# Patient Record
Sex: Female | Born: 1990 | Race: Black or African American | Hispanic: No | Marital: Single | Smoking: Former smoker
Health system: Southern US, Community
[De-identification: ages and names within clinical notes are randomized; demographics above are authoritative.]

## PROBLEM LIST (undated history)

## (undated) DIAGNOSIS — K219 Gastro-esophageal reflux disease without esophagitis: Secondary | ICD-10-CM

## (undated) DIAGNOSIS — J189 Pneumonia, unspecified organism: Secondary | ICD-10-CM

## (undated) DIAGNOSIS — G43909 Migraine, unspecified, not intractable, without status migrainosus: Secondary | ICD-10-CM

## (undated) DIAGNOSIS — R569 Unspecified convulsions: Secondary | ICD-10-CM

## (undated) HISTORY — PX: WISDOM TOOTH EXTRACTION: SHX21

---

## 2010-06-03 ENCOUNTER — Emergency Department (HOSPITAL_BASED_OUTPATIENT_CLINIC_OR_DEPARTMENT_OTHER): Admission: EM | Admit: 2010-06-03 | Discharge: 2010-06-03 | Payer: Self-pay | Admitting: Emergency Medicine

## 2010-06-03 ENCOUNTER — Ambulatory Visit: Payer: Self-pay | Admitting: Diagnostic Radiology

## 2010-12-25 LAB — COMPREHENSIVE METABOLIC PANEL
Albumin: 4.1 g/dL (ref 3.5–5.2)
Alkaline Phosphatase: 80 U/L (ref 39–117)
BUN: 8 mg/dL (ref 6–23)
Calcium: 9.3 mg/dL (ref 8.4–10.5)
GFR calc Af Amer: >60 mL/min (ref >60)
GFR calc non Af Amer: >60 mL/min (ref >60)
Glucose, Bld: 82 mg/dL (ref 70–99)
Potassium: 4.3 mEq/L (ref 3.5–5.1)
Sodium: 142 mEq/L (ref 135–145)

## 2010-12-25 LAB — CBC
HCT: 35.7 % — ABNORMAL LOW (ref 36.0–46.0)
MCHC: 33.4 g/dL (ref 30.0–36.0)
Platelets: 223 10*3/uL (ref 150–400)
RDW: 12.9 % (ref 11.5–15.5)

## 2010-12-25 LAB — DIFFERENTIAL
Basophils Relative: 1 % (ref 0–1)
Lymphocytes Relative: 18 % (ref 12–46)
Lymphs Abs: 1.8 10*3/uL (ref 0.7–4.0)
Monocytes Relative: 8 % (ref 3–12)
Neutro Abs: 7.6 10*3/uL (ref 1.7–7.7)
Neutrophils Relative %: 73 % (ref 43–77)

## 2011-07-03 ENCOUNTER — Encounter: Payer: Self-pay | Admitting: *Deleted

## 2011-07-03 ENCOUNTER — Emergency Department (HOSPITAL_BASED_OUTPATIENT_CLINIC_OR_DEPARTMENT_OTHER)
Admission: EM | Admit: 2011-07-03 | Discharge: 2011-07-03 | Disposition: A | Discharge status: Home / Self Care | Payer: Self-pay | Attending: Emergency Medicine | Admitting: Emergency Medicine

## 2011-07-03 DIAGNOSIS — IMO0002 Reserved for concepts with insufficient information to code with codable children: Secondary | ICD-10-CM | POA: Insufficient documentation

## 2011-07-03 DIAGNOSIS — IMO0001 Reserved for inherently not codable concepts without codable children: Secondary | ICD-10-CM | POA: Insufficient documentation

## 2011-07-03 DIAGNOSIS — W57XXXA Bitten or stung by nonvenomous insect and other nonvenomous arthropods, initial encounter: Secondary | ICD-10-CM

## 2011-07-03 DIAGNOSIS — Y92009 Unspecified place in unspecified non-institutional (private) residence as the place of occurrence of the external cause: Secondary | ICD-10-CM | POA: Insufficient documentation

## 2011-07-03 MED ORDER — HYDROXYZINE HCL 25 MG PO TABS: 25.0000 mg | ORAL_TABLET | Freq: Four times a day (QID) | ORAL | Status: AC

## 2011-07-03 NOTE — ED Provider Notes (Signed)
History     CSN: [id number] Arrival date & time: 07/03/2011  7:01 PM  Chief Complaint  Patient presents with  . Insect Bite    HPI  (Consider location/radiation/quality/duration/timing/severity/associated sxs/prior treatment)  HPI  patient thinks she was bitten by a spider today. She walked in spiderweb and then felt small bites on her right arm as well as her right foot. Patient states she noticed a slight raised red areas to both areas. Symptoms have been associated with a burning and itching-type discomfort. There has been no radiation of the discomfort. Symptoms started today. They are mild in nature. She took some Benadryl and has had some improvement of her symptoms. There has been no shortness of breath no fevers or coughing. No trouble breathing or swallowing.  History reviewed. No pertinent past medical history.  History reviewed. No pertinent past surgical history.  History reviewed. No pertinent family history.  History  Substance Use Topics  . Smoking status: Not on file  . Smokeless tobacco: Not on file  . Alcohol Use: Not on file    OB History    Grav Para Term Preterm Abortions TAB SAB Ect Mult Living                  Review of Systems  Review of Systems  All other systems reviewed and are negative.    Allergies  Codeine and Penicillins  Home Medications   Current Outpatient Rx  Name Route Sig Dispense Refill  . DIPHENHYDRAMINE HCL 25 MG PO TABS Oral Take 50 mg by mouth once. For allergic reaction       Physical Exam    BP 141/86  Pulse 110  Temp(Src) 98 F (36.7 C) (Oral)  Resp 20  Ht 5\' 5"  (1.651 m)  Wt 255 lb (115.667 kg)  BMI 42.43 kg/m2  SpO2 98%  LMP 06/23/2011  Physical Exam  Nursing note and vitals reviewed. Constitutional: She appears well-developed and well-nourished. No distress.  HENT:  Head: Normocephalic and atraumatic.  Right Ear: External ear normal.  Left Ear: External ear normal.  Eyes: Conjunctivae are normal.  Right eye exhibits no discharge. Left eye exhibits no discharge. No scleral icterus.  Neck: Neck supple. No tracheal deviation present.  Pulmonary/Chest: Effort normal. No stridor. No respiratory distress.  Musculoskeletal: She exhibits no edema.  Neurological: She is alert. Cranial nerve deficit: no gross deficits.  Skin: Skin is warm and dry. No rash noted.       1 small papular area on the right upper arm, less than 1 cm in size, additional few millimeter lesion right foot, mild erythema, no lymphangitic streaking, no pustules  Psychiatric: She has a normal mood and affect.    ED Course  Procedures (including critical care time)  Labs Reviewed - No data to display No results found.   1. Insect bite      MDM Patient has a mild insect bite. There does not appear to be any evidence of any systemic reaction. There is noted to suggest infection. I recommended the patient take antihistamines. She could also take Tylenol or Advil. She should monitor for any signs of infection.        Celene Kras, MD 07/03/11 [phone]

## 2011-07-03 NOTE — ED Notes (Signed)
Pt states she walked into a spider web and was bitten on both arms and her inner thighs. C/O pain to same. Slight raised red area to right forearm.

## 2011-08-30 ENCOUNTER — Emergency Department (HOSPITAL_BASED_OUTPATIENT_CLINIC_OR_DEPARTMENT_OTHER)
Admission: EM | Admit: 2011-08-30 | Discharge: 2011-08-30 | Disposition: A | Discharge status: Home / Self Care | Payer: No Typology Code available for payment source | Attending: Emergency Medicine | Admitting: Emergency Medicine

## 2011-08-30 ENCOUNTER — Encounter (HOSPITAL_BASED_OUTPATIENT_CLINIC_OR_DEPARTMENT_OTHER): Payer: Self-pay | Admitting: *Deleted

## 2011-08-30 ENCOUNTER — Emergency Department (INDEPENDENT_AMBULATORY_CARE_PROVIDER_SITE_OTHER): Payer: No Typology Code available for payment source

## 2011-08-30 DIAGNOSIS — Y92410 Unspecified street and highway as the place of occurrence of the external cause: Secondary | ICD-10-CM | POA: Insufficient documentation

## 2011-08-30 DIAGNOSIS — M25569 Pain in unspecified knee: Secondary | ICD-10-CM

## 2011-08-30 DIAGNOSIS — S8000XA Contusion of unspecified knee, initial encounter: Secondary | ICD-10-CM | POA: Insufficient documentation

## 2011-08-30 MED ORDER — HYDROCODONE-ACETAMINOPHEN 5-500 MG PO TABS: 1.0000 | ORAL_TABLET | Freq: Four times a day (QID) | ORAL | Status: AC | PRN
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
## 2011-09-14 ENCOUNTER — Other Ambulatory Visit: Payer: Self-pay

## 2011-09-14 ENCOUNTER — Emergency Department (INDEPENDENT_AMBULATORY_CARE_PROVIDER_SITE_OTHER): Payer: No Typology Code available for payment source

## 2011-09-14 ENCOUNTER — Encounter (HOSPITAL_BASED_OUTPATIENT_CLINIC_OR_DEPARTMENT_OTHER): Payer: Self-pay

## 2011-09-14 ENCOUNTER — Emergency Department (HOSPITAL_BASED_OUTPATIENT_CLINIC_OR_DEPARTMENT_OTHER)
Admission: EM | Admit: 2011-09-14 | Discharge: 2011-09-14 | Disposition: A | Discharge status: Home / Self Care | Payer: No Typology Code available for payment source | Attending: Emergency Medicine | Admitting: Emergency Medicine

## 2011-09-14 DIAGNOSIS — R079 Chest pain, unspecified: Secondary | ICD-10-CM | POA: Insufficient documentation

## 2011-09-14 DIAGNOSIS — M549 Dorsalgia, unspecified: Secondary | ICD-10-CM | POA: Insufficient documentation

## 2011-09-14 DIAGNOSIS — R51 Headache: Secondary | ICD-10-CM

## 2011-09-14 DIAGNOSIS — J189 Pneumonia, unspecified organism: Secondary | ICD-10-CM | POA: Insufficient documentation

## 2011-09-14 DIAGNOSIS — R509 Fever, unspecified: Secondary | ICD-10-CM

## 2011-09-14 DIAGNOSIS — R1110 Vomiting, unspecified: Secondary | ICD-10-CM | POA: Insufficient documentation

## 2011-09-14 MED ORDER — IBUPROFEN 800 MG PO TABS
800.0000 mg | ORAL_TABLET | Freq: Once | ORAL | Status: AC
  Administered 2011-09-14: 800 mg via ORAL
  Filled 2011-09-14: qty 1

## 2011-09-14 MED ORDER — AZITHROMYCIN 250 MG PO TABS
500.0000 mg | ORAL_TABLET | Freq: Once | ORAL | Status: AC
  Administered 2011-09-14: 500 mg via ORAL
  Filled 2011-09-14: qty 2

## 2011-09-14 MED ORDER — AZITHROMYCIN 250 MG PO TABS: 250.0000 mg | ORAL_TABLET | Freq: Every day | ORAL | Status: AC

## 2011-09-14 NOTE — ED Provider Notes (Signed)
History     CSN: [id number] Arrival date & time: 09/14/2011  2:55 PM   First MD Initiated Contact with Patient 09/14/11 1553      Chief Complaint  Patient presents with  . Back Pain  . Chest Pain  . Emesis  . Fever    (Consider location/radiation/quality/duration/timing/severity/associated sxs/prior treatment) HPI Comments: Patient presents with one day of central chest pain that is associated with no specific cough.  Patient does note some nasal congestion with this.  She's had one episode of nausea and vomiting today.  She has no abdominal pain.  Patient had not noted fevers at home although she does have one here on initial evaluation.  She's not short of breath.  She does not smoke.  Patient did not try any medications at home for this.    Patient is a 20 y.o. female presenting with back pain, chest pain, vomiting, and fever. The history is provided by the patient. No language interpreter was used.  Back Pain  This is a new problem. The current episode started yesterday. The problem occurs constantly. The problem has not changed since onset.The pain is associated with no known injury. The pain is mild. Associated symptoms include chest pain and a fever. Pertinent negatives include no headaches, no abdominal pain and no dysuria. She has tried nothing for the symptoms.  Chest Pain Primary symptoms include a fever, cough, nausea and vomiting. Pertinent negatives for primary symptoms include no shortness of breath and no abdominal pain.    Emesis  Associated symptoms include cough and a fever. Pertinent negatives include no abdominal pain, no chills, no diarrhea and no headaches.  Fever Primary symptoms of the febrile illness include fever, cough, nausea and vomiting. Primary symptoms do not include headaches, shortness of breath, abdominal pain, diarrhea, dysuria or rash.    History reviewed. No pertinent past medical history.  History reviewed. No pertinent past surgical  history.  No family history on file.  History  Substance Use Topics  . Smoking status: Never Smoker   . Smokeless tobacco: Never Used  . Alcohol Use: No    OB History    Grav Para Term Preterm Abortions TAB SAB Ect Mult Living                  Review of Systems  Constitutional: Positive for fever. Negative for chills.  HENT: Positive for congestion and rhinorrhea.   Eyes: Negative.  Negative for discharge and redness.  Respiratory: Positive for cough. Negative for shortness of breath.   Cardiovascular: Positive for chest pain.  Gastrointestinal: Positive for nausea and vomiting. Negative for abdominal pain and diarrhea.  Genitourinary: Positive for vaginal bleeding. Negative for dysuria and vaginal discharge.       Patient is on her period at this time  Musculoskeletal: Positive for back pain.  Skin: Negative.  Negative for color change and rash.  Neurological: Negative.  Negative for syncope and headaches.  Hematological: Negative.  Negative for adenopathy.  Psychiatric/Behavioral: Negative.  Negative for confusion.  All other systems reviewed and are negative.    Allergies  Codeine and Penicillins  Home Medications   Current Outpatient Rx  Name Route Sig Dispense Refill  . ACETAMINOPHEN 500 MG PO TABS Oral Take 500 mg by mouth every 6 (six) hours as needed. For pain     . NORGESTIM-ETH ESTRAD TRIPHASIC 0.18/0.215/0.25 MG-35 MCG PO TABS Oral Take 1 tablet by mouth daily.      Marland Kitchen DIPHENHYDRAMINE HCL 25 MG PO  TABS Oral Take 50 mg by mouth once. For allergic reaction       BP 129/59  Pulse 106  Temp(Src) 102.6 F (39.2 C) (Oral)  Resp 20  Ht 5\' 6"  (1.676 m)  Wt 255 lb (115.667 kg)  BMI 41.16 kg/m2  SpO2 100%  LMP 09/14/2011  Physical Exam  Nursing note and vitals reviewed. Constitutional: She is oriented to person, place, and time. She appears well-developed and well-nourished.  Non-toxic appearance. She does not have a sickly appearance.  HENT:  Head:  Normocephalic and atraumatic.  Eyes: Conjunctivae, EOM and lids are normal. Pupils are equal, round, and reactive to light. No scleral icterus.  Neck: Trachea normal and normal range of motion. Neck supple.  Cardiovascular: Regular rhythm and normal heart sounds.  Tachycardia present.  Exam reveals no gallop and no friction rub.   No murmur heard. Pulmonary/Chest: Effort normal and breath sounds normal. No respiratory distress. She has no wheezes. She has no rales.  Abdominal: Soft. Normal appearance. There is no tenderness. There is no rebound, no guarding and no CVA tenderness.  Musculoskeletal: Normal range of motion.  Neurological: She is alert and oriented to person, place, and time. She has normal strength.  Skin: Skin is warm, dry and intact. No rash noted.  Psychiatric: She has a normal mood and affect. Her behavior is normal. Judgment and thought content normal.    ED Course  Procedures (including critical care time)  Labs Reviewed - No data to display Dg Chest 2 View  09/14/2011  *RADIOLOGY REPORT*  Clinical Data: Chest pain.  Fever.  Headache.  CHEST - 2 VIEW 09/14/2011:  Comparison: None.  Findings: Focal airspace opacity in the medial left lower lobe. Lungs otherwise clear.  No pleural effusions.  Cardiomediastinal silhouette unremarkable.  Visualized bony thorax intact.  IMPRESSION: Focal left lower lobe pneumonia suspected.  Original Report Authenticated By: Arnell Sieving, M.D.     No diagnosis found.    MDM  Patient presents with chest pain with associated fever and some mild tachycardia although she has a normal oxygen saturation.  Her tachycardia is likely related to her fever.  Patient appears to have a focal pneumonia on evaluation of her chest x-ray as well place her on azithromycin for the next 5 days.  We'll give her an initial dose here.  At this point in time as the patient is otherwise healthy and in no respiratory distress I feel that she is safe for  discharge home.        Nat Christen, MD 09/14/11 [phone]
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
## 2011-09-14 NOTE — ED Notes (Signed)
Pt requested copy of RTW note-second copy printed

## 2011-09-14 NOTE — ED Notes (Signed)
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 And showed ecg to Dr. Golda Acre who told me to have patient rest in waiting room, I got warm blanket for her.

## 2011-09-14 NOTE — ED Notes (Signed)
Pt reports onset of chest tightness (midsternal), back pain and fever last night.

## 2012-03-22 ENCOUNTER — Emergency Department (HOSPITAL_BASED_OUTPATIENT_CLINIC_OR_DEPARTMENT_OTHER): Payer: Self-pay

## 2012-03-22 ENCOUNTER — Ambulatory Visit (HOSPITAL_BASED_OUTPATIENT_CLINIC_OR_DEPARTMENT_OTHER): Payer: No Typology Code available for payment source

## 2012-03-22 ENCOUNTER — Encounter (HOSPITAL_BASED_OUTPATIENT_CLINIC_OR_DEPARTMENT_OTHER): Payer: Self-pay | Admitting: *Deleted

## 2012-03-22 ENCOUNTER — Emergency Department (HOSPITAL_BASED_OUTPATIENT_CLINIC_OR_DEPARTMENT_OTHER)
Admission: EM | Admit: 2012-03-22 | Discharge: 2012-03-22 | Disposition: A | Discharge status: Home / Self Care | Payer: Self-pay | Attending: Emergency Medicine | Admitting: Emergency Medicine

## 2012-03-22 DIAGNOSIS — K219 Gastro-esophageal reflux disease without esophagitis: Secondary | ICD-10-CM | POA: Insufficient documentation

## 2012-03-22 DIAGNOSIS — S8001XA Contusion of right knee, initial encounter: Secondary | ICD-10-CM

## 2012-03-22 DIAGNOSIS — W108XXA Fall (on) (from) other stairs and steps, initial encounter: Secondary | ICD-10-CM | POA: Insufficient documentation

## 2012-03-22 DIAGNOSIS — S8000XA Contusion of unspecified knee, initial encounter: Secondary | ICD-10-CM | POA: Insufficient documentation

## 2012-03-22 HISTORY — DX: Pneumonia, unspecified organism: J18.9

## 2012-03-22 HISTORY — DX: Gastro-esophageal reflux disease without esophagitis: K21.9

## 2012-03-22 LAB — BASIC METABOLIC PANEL
CO2: 29 mEq/L (ref 19–32)
Calcium: 9.3 mg/dL (ref 8.4–10.5)
Creatinine, Ser: 0.7 mg/dL (ref 0.50–1.10)
GFR calc Af Amer: >90 mL/min (ref >90)
Glucose, Bld: 101 mg/dL — ABNORMAL HIGH (ref 70–99)
Sodium: 141 mEq/L (ref 135–145)

## 2012-03-22 LAB — URINALYSIS, ROUTINE W REFLEX MICROSCOPIC
Bilirubin Urine: NEGATIVE
Glucose, UA: NEGATIVE mg/dL
Hgb urine dipstick: NEGATIVE
Ketones, ur: NEGATIVE mg/dL
Protein, ur: NEGATIVE mg/dL
Specific Gravity, Urine: 1.019 (ref 1.005–1.030)
pH: 6.5 (ref 5.0–8.0)

## 2012-03-22 LAB — URINE MICROSCOPIC-ADD ON

## 2012-03-22 LAB — CBC
HCT: 36.6 % (ref 36.0–46.0)
MCV: 88.4 fL (ref 78.0–100.0)
RBC: 4.14 MIL/uL (ref 3.87–5.11)
WBC: 5.8 10*3/uL (ref 4.0–10.5)

## 2012-03-22 LAB — DIFFERENTIAL
Basophils Absolute: 0 10*3/uL (ref 0.0–0.1)
Basophils Relative: 0 % (ref 0–1)
Lymphocytes Relative: 35 % (ref 12–46)
Monocytes Relative: 5 % (ref 3–12)
Neutro Abs: 3.3 10*3/uL (ref 1.7–7.7)

## 2012-03-22 LAB — D-DIMER, QUANTITATIVE: D-Dimer, Quant: <0.22 ug/mL-FEU (ref 0.00–0.48)

## 2012-03-22 NOTE — ED Notes (Signed)
Pt sts "I'm having knee pain but I really want to make sure everything is ok because I took a home pregnancy test and it was positive".

## 2012-03-22 NOTE — Discharge Instructions (Signed)
Contusion A contusion is a deep bruise. Contusions happen when an injury causes bleeding under the skin. Signs of bruising include pain, puffiness (swelling), and discolored skin. The contusion may turn [place], purple, or yellow. HOME CARE   Put ice on the injured area.   Put ice in a plastic bag.   Place a towel between your skin and the bag.   Leave the ice on for 15 to 20 minutes, 3 to 4 times a day.   Only take medicine as told by your doctor.   Rest the injured area.   If possible, raise (elevate) the injured area to lessen puffiness.  GET HELP RIGHT AWAY IF:   You have more bruising or puffiness.   You have pain that is getting worse.   Your puffiness or pain is not helped by medicine.  MAKE SURE YOU:   Understand these instructions.   Will watch your condition.   Will get help right away if you are not doing well or get worse.  Document Released: 03/15/2008 Document Revised: 09/16/2011 Document Reviewed: 08/02/2011 [facility] Patient Information 2012 [place], [place].   You had fallen and fell on the right knee.  Your exam of the right knee was negative.  Also, your pregnancy test was negative today.

## 2012-03-22 NOTE — ED Notes (Signed)
Patient states she fell down approximately 10 steps this morning.  States she is having right knee pain.  States she has recently had a positive pregnancy test and wants to be checked out for her pregnancy.   LMP 02/10/12.

## 2012-03-22 NOTE — ED Provider Notes (Signed)
History     CSN: [id number]  Arrival date & time 03/22/12  1308   First MD Initiated Contact with Patient 03/22/12 1128      Chief Complaint  Patient presents with  . Fall  . Knee Pain    (Consider location/radiation/quality/duration/timing/severity/associated sxs/prior treatment) Patient is a 21 y.o. female presenting with fall and knee pain. The history is provided by the patient.  Fall The accident occurred 1 to 2 hours ago. Incident: Pt fell down steps, hurting her right knee.  She is concerned because she had taken a pregnancy test a few days ago and it was positive and she wanted to be sure her pregnancy was all right.  She has had no vaginal bleeding and was able to walk all right. Distance fallen: Fell down several stairs. There was no blood loss. The point of impact was the right knee. The pain is present in the right knee. The pain is at a severity of 5/10. The pain is moderate. She was ambulatory at the scene. There was no entrapment after the fall. There was no drug use involved in the accident. There was no alcohol use involved in the accident. Associated symptoms comments: Recent positive pregnancy test, LMP 02/10/2012.. The symptoms are aggravated by standing and ambulation. She has tried nothing for the symptoms.  Knee Pain    Past Medical History  Diagnosis Date  . Pneumonia   . Acid reflux     History reviewed. No pertinent past surgical history.  No family history on file.  History  Substance Use Topics  . Smoking status: Never Smoker   . Smokeless tobacco: Never Used  . Alcohol Use: No    OB History    Grav Para Term Preterm Abortions TAB SAB Ect Mult Living                  Review of Systems  Constitutional: Negative.   HENT: Negative.   Eyes: Negative.   Respiratory: Negative.   Cardiovascular: Negative.   Genitourinary:       Reportedly positive pregnancy test; LMP 02/10/2012.  Musculoskeletal:       Right knee injury.  Skin: Negative.     Neurological: Negative.   Hematological: Negative.   Psychiatric/Behavioral: Negative.     Allergies  Codeine and Penicillins  Home Medications   Current Outpatient Rx  Name Route Sig Dispense Refill  . ACETAMINOPHEN 500 MG PO TABS Oral Take 500 mg by mouth every 6 (six) hours as needed. For pain     . DIPHENHYDRAMINE HCL 25 MG PO TABS Oral Take 50 mg by mouth once. For allergic reaction     . NORGESTIM-ETH ESTRAD TRIPHASIC 0.18/0.215/0.25 MG-35 MCG PO TABS Oral Take 1 tablet by mouth daily.        BP 150/88  Pulse 71  Temp 98.8 F (37.1 C) (Oral)  Resp 16  Ht 5\' 5"  (1.651 m)  Wt 250 lb (113.399 kg)  BMI 41.60 kg/m2  SpO2 100%  LMP 02/10/2012  Physical Exam  Nursing note and vitals reviewed. Constitutional: She is oriented to person, place, and time.       Morbidly obese young woman, no distress.  HENT:  Head: Normocephalic and atraumatic.  Right Ear: External ear normal.  Left Ear: External ear normal.  Mouth/Throat: Oropharynx is clear and moist.  Eyes: Conjunctivae and EOM are normal. Pupils are equal, round, and reactive to light.  Neck: Normal range of motion. Neck supple.  Cardiovascular: Normal rate, regular  rhythm and normal heart sounds.   Pulmonary/Chest: Effort normal and breath sounds normal.  Abdominal: Soft. Bowel sounds are normal. She exhibits no distension. There is no tenderness.  Musculoskeletal:       She localizes pain over the right patella. There is no mark on the skin, and no swelling of the region around the patella.  The knee has full range of motion, no effusion, no ligamentous instability.  She has intact pulses, sensation and tendon function in the right foot.  Neurological: She is alert and oriented to person, place, and time.       No sensory or motor deficit.  Skin: Skin is warm and dry.  Psychiatric: She has a normal mood and affect. Her behavior is normal.    ED Course  Procedures (including critical care time)  Labs Reviewed   BASIC METABOLIC PANEL - Abnormal; Notable for the following:    Glucose, Bld 101 (*)     All other components within normal limits  URINALYSIS, ROUTINE W REFLEX MICROSCOPIC - Abnormal; Notable for the following:    Leukocytes, UA SMALL (*)     All other components within normal limits  URINE MICROSCOPIC-ADD ON - Abnormal; Notable for the following:    Squamous Epithelial / LPF FEW (*)     Bacteria, UA FEW (*)     All other components within normal limits  PREGNANCY, URINE  CBC  DIFFERENTIAL  D-DIMER, QUANTITATIVE   Urine pregnancy test was negative.  Pt advised of this.  Exam did not show any indication for imaging.  Reassured and released.   1. Contusion of right knee         Carleene Cooper III, MD 03/22/12 2111

## 2012-07-21 ENCOUNTER — Encounter (HOSPITAL_BASED_OUTPATIENT_CLINIC_OR_DEPARTMENT_OTHER): Payer: Self-pay | Admitting: *Deleted

## 2012-07-21 ENCOUNTER — Emergency Department (HOSPITAL_BASED_OUTPATIENT_CLINIC_OR_DEPARTMENT_OTHER)
Admission: EM | Admit: 2012-07-21 | Discharge: 2012-07-21 | Disposition: A | Discharge status: Home / Self Care | Payer: Self-pay | Attending: Emergency Medicine | Admitting: Emergency Medicine

## 2012-07-21 DIAGNOSIS — Z880 Allergy status to penicillin: Secondary | ICD-10-CM | POA: Insufficient documentation

## 2012-07-21 DIAGNOSIS — J029 Acute pharyngitis, unspecified: Secondary | ICD-10-CM | POA: Insufficient documentation

## 2012-07-21 DIAGNOSIS — Z886 Allergy status to analgesic agent status: Secondary | ICD-10-CM | POA: Insufficient documentation

## 2012-07-21 DIAGNOSIS — K219 Gastro-esophageal reflux disease without esophagitis: Secondary | ICD-10-CM | POA: Insufficient documentation

## 2012-07-21 NOTE — ED Provider Notes (Signed)
History     CSN: [id number]  Arrival date & time 07/21/12  4696   First MD Initiated Contact with Patient 07/21/12 1050      Chief Complaint  Patient presents with  . Sore Throat  . URI    (Consider location/radiation/quality/duration/timing/severity/associated sxs/prior treatment) HPI Pt reports she woke up this morning with dry cough, sore throat, moderate aching and worse with swallowing/coughing. No associated fever. Felt well when she went to bed last night. Denies any vomiting. No nasal congestion. She has had strep pharyngitis previously.   Past Medical History  Diagnosis Date  . Pneumonia   . Acid reflux     History reviewed. No pertinent past surgical history.  No family history on file.  History  Substance Use Topics  . Smoking status: Never Smoker   . Smokeless tobacco: Never Used  . Alcohol Use: No    OB History    Grav Para Term Preterm Abortions TAB SAB Ect Mult Living                  Review of Systems All other systems reviewed and are negative except as noted in HPI.   Allergies  Codeine and Penicillins  Home Medications   Current Outpatient Rx  Name Route Sig Dispense Refill  . ACETAMINOPHEN 500 MG PO TABS Oral Take 500 mg by mouth every 6 (six) hours as needed. For pain     . DIPHENHYDRAMINE HCL 25 MG PO TABS Oral Take 50 mg by mouth once. For allergic reaction     . NORGESTIM-ETH ESTRAD TRIPHASIC 0.18/0.215/0.25 MG-35 MCG PO TABS Oral Take 1 tablet by mouth daily.        BP 140/88  Pulse 63  Temp 98.4 F (36.9 C) (Oral)  Resp 18  Ht 5\' 6"  (1.676 m)  Wt 270 lb (122.471 kg)  BMI 43.58 kg/m2  SpO2 100%  LMP 07/06/2012  Physical Exam  Nursing note and vitals reviewed. Constitutional: She is oriented to person, place, and time. She appears well-developed and well-nourished.  HENT:  Head: Normocephalic and atraumatic.       Tonsils enlarged but no erythema or exudate  Eyes: EOM are normal. Pupils are equal, round, and reactive  to light.  Neck: Normal range of motion. Neck supple.  Cardiovascular: Normal rate, normal heart sounds and intact distal pulses.   Pulmonary/Chest: Effort normal and breath sounds normal.  Abdominal: Bowel sounds are normal. She exhibits no distension. There is no tenderness.  Musculoskeletal: Normal range of motion. She exhibits no edema and no tenderness.  Lymphadenopathy:    She has no cervical adenopathy.  Neurological: She is alert and oriented to person, place, and time. She has normal strength. No cranial nerve deficit or sensory deficit.  Skin: Skin is warm and dry. No rash noted.  Psychiatric: She has a normal mood and affect.    ED Course  Procedures (including critical care time)   Labs Reviewed  RAPID STREP SCREEN  STREP A DNA PROBE   No results found.   No diagnosis found.    MDM  Strep neg, no concerning physical exam findings for strep. Advised OTC symptomatic care. Swab sent for culture.         Charles B. Bernette Mayers, MD 07/21/12 1104

## 2012-07-21 NOTE — ED Notes (Signed)
Patient states she woke up this morning with a sore throat, sinus pain and burning pain in her central chest down to her abdomen.

## 2012-07-22 LAB — STREP A DNA PROBE: Group A Strep Probe: NEGATIVE

## 2012-08-01 IMAGING — CR DG CHEST 2V
2 series · 2 of 2 positions shown · non-contrast
Comparison: None.

CLINICAL DATA: Chest pain.  Fever.  Headache.

CHEST - 2 VIEW 09/14/2011:

[w chest pa]
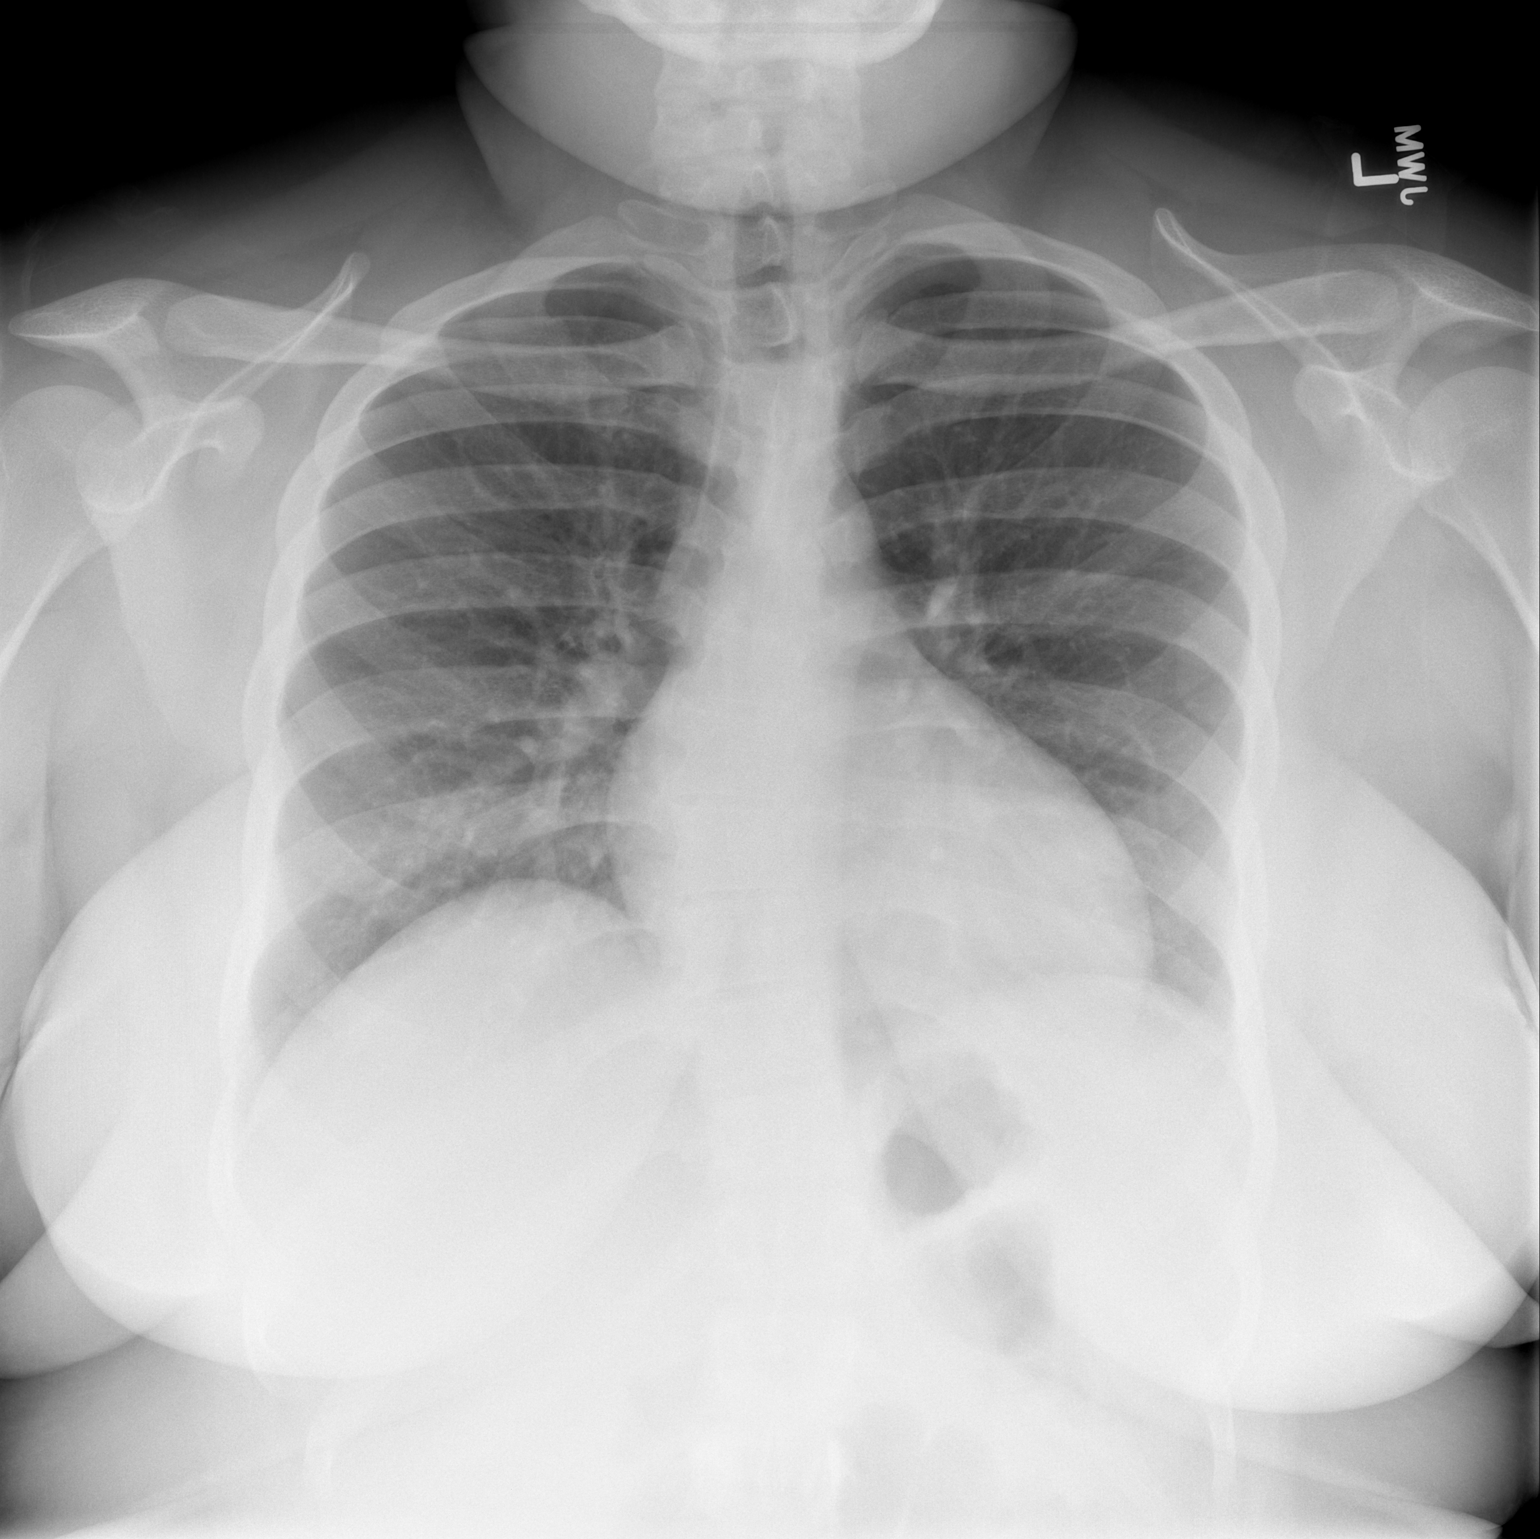

[w chest lat]
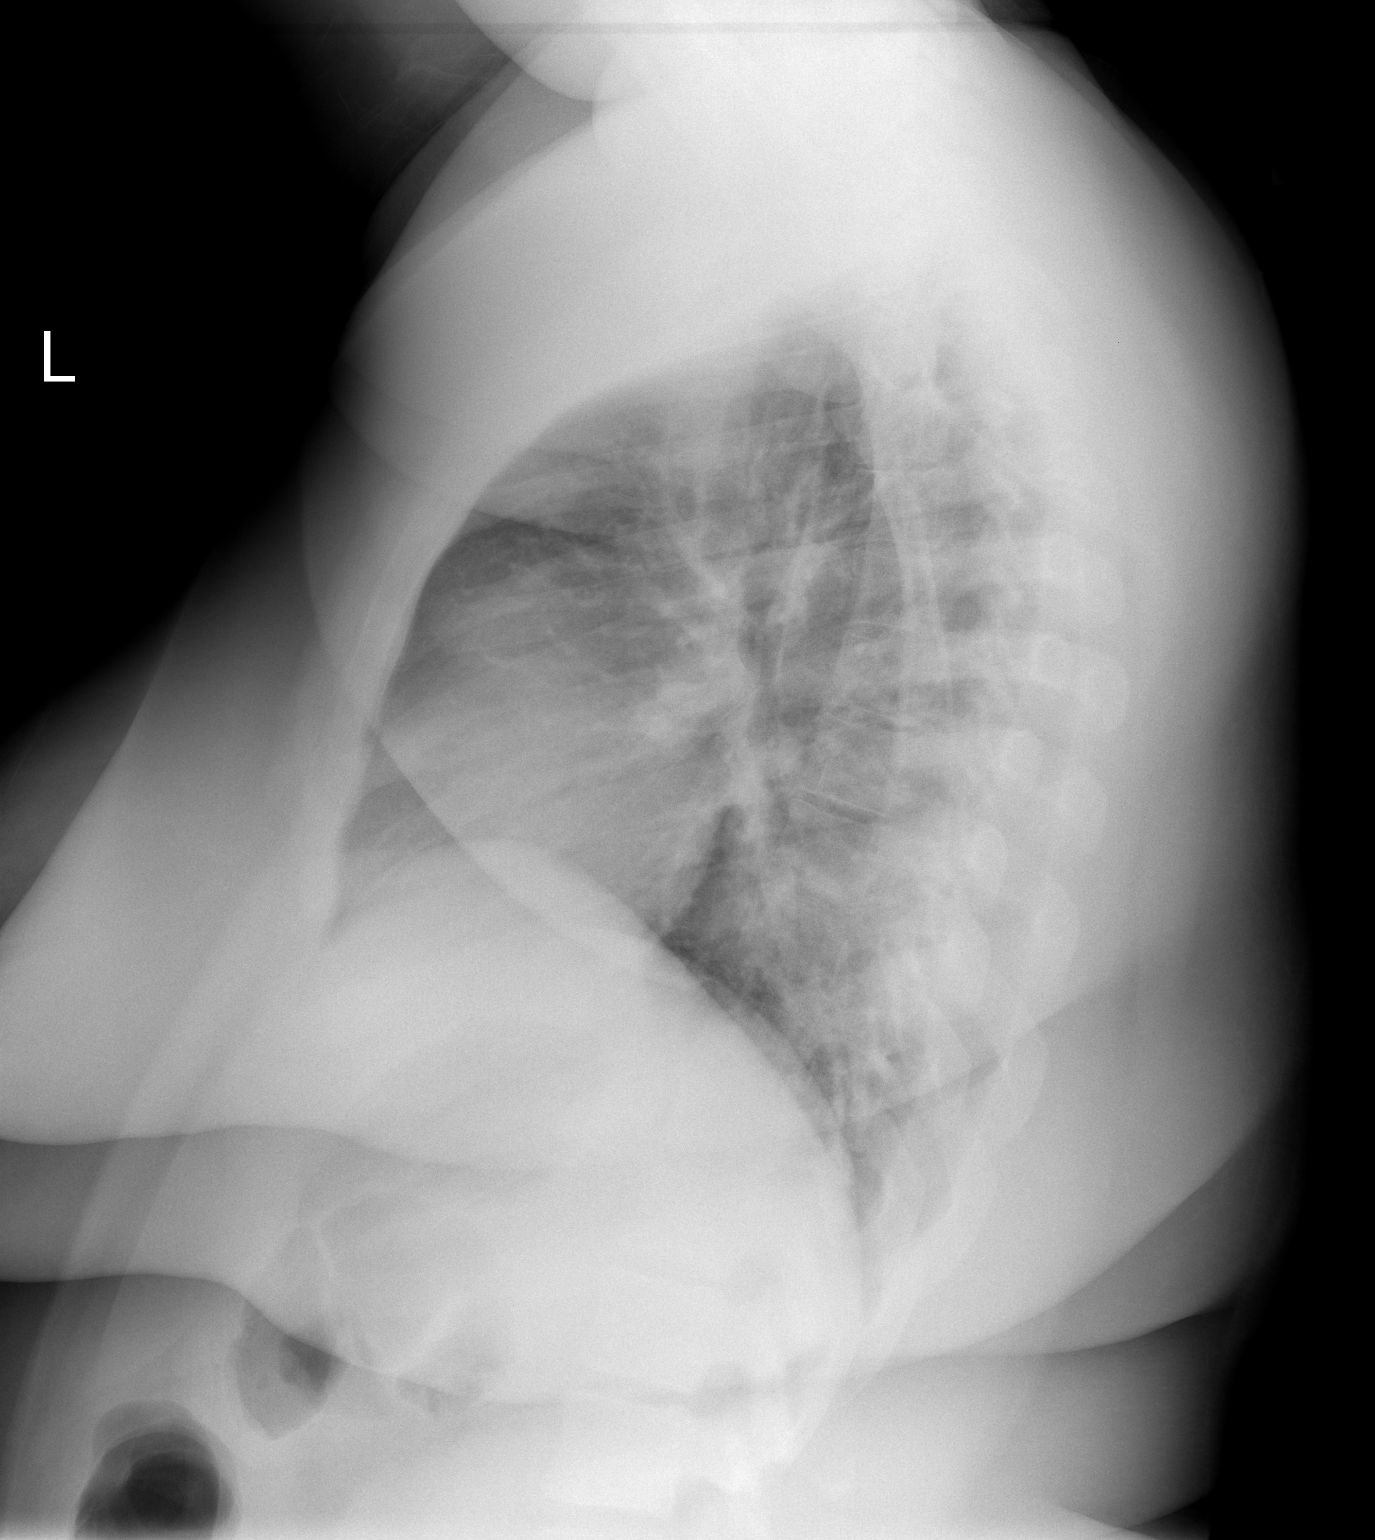

[2 of 2 positions shown; findings below may reference images not displayed]

FINDINGS: Focal airspace opacity in the medial left lower lobe.
Lungs otherwise clear.  No pleural effusions.  Cardiomediastinal
silhouette unremarkable.  Visualized bony thorax intact.
IMPRESSION: Focal left lower lobe pneumonia suspected.

## 2012-08-13 ENCOUNTER — Encounter (HOSPITAL_BASED_OUTPATIENT_CLINIC_OR_DEPARTMENT_OTHER): Payer: Self-pay | Admitting: *Deleted

## 2012-08-13 ENCOUNTER — Emergency Department (HOSPITAL_BASED_OUTPATIENT_CLINIC_OR_DEPARTMENT_OTHER): Payer: Self-pay

## 2012-08-13 ENCOUNTER — Emergency Department (HOSPITAL_BASED_OUTPATIENT_CLINIC_OR_DEPARTMENT_OTHER)
Admission: EM | Admit: 2012-08-13 | Discharge: 2012-08-14 | Disposition: A | Discharge status: Home / Self Care | Payer: Self-pay | Attending: Emergency Medicine | Admitting: Emergency Medicine

## 2012-08-13 DIAGNOSIS — Z8701 Personal history of pneumonia (recurrent): Secondary | ICD-10-CM | POA: Insufficient documentation

## 2012-08-13 DIAGNOSIS — R5381 Other malaise: Secondary | ICD-10-CM | POA: Insufficient documentation

## 2012-08-13 DIAGNOSIS — R05 Cough: Secondary | ICD-10-CM | POA: Insufficient documentation

## 2012-08-13 DIAGNOSIS — R112 Nausea with vomiting, unspecified: Secondary | ICD-10-CM | POA: Insufficient documentation

## 2012-08-13 DIAGNOSIS — R059 Cough, unspecified: Secondary | ICD-10-CM | POA: Insufficient documentation

## 2012-08-13 DIAGNOSIS — R071 Chest pain on breathing: Secondary | ICD-10-CM | POA: Insufficient documentation

## 2012-08-13 DIAGNOSIS — K219 Gastro-esophageal reflux disease without esophagitis: Secondary | ICD-10-CM | POA: Insufficient documentation

## 2012-08-13 DIAGNOSIS — J3489 Other specified disorders of nose and nasal sinuses: Secondary | ICD-10-CM | POA: Insufficient documentation

## 2012-08-13 DIAGNOSIS — J40 Bronchitis, not specified as acute or chronic: Secondary | ICD-10-CM | POA: Insufficient documentation

## 2012-08-13 DIAGNOSIS — Z79899 Other long term (current) drug therapy: Secondary | ICD-10-CM | POA: Insufficient documentation

## 2012-08-13 LAB — PREGNANCY, URINE: Preg Test, Ur: NEGATIVE

## 2012-08-13 NOTE — ED Notes (Signed)
Pt states she has had a cold for about a week. She also reports body aches x 3 days. Hx pneumonia about this time last year. "This feels the same, but worse"

## 2012-08-13 NOTE — ED Notes (Signed)
Pt was triaged by D. Baron Hamper, RN under CM

## 2012-08-13 NOTE — ED Notes (Signed)
MD at bedside. 

## 2012-08-13 NOTE — ED Provider Notes (Signed)
History     CSN: [id number]  Arrival date & time 08/13/12  2213   None     Chief Complaint  Patient presents with  . Generalized Body Aches    (Consider location/radiation/quality/duration/timing/severity/associated sxs/prior treatment) HPI This is a 21 year old female with cold symptoms for the last week and a half. Specifically she has been having nasal congestion, rhinorrhea, cough and malaise. She is not aware she's had a fever. The last 3 days she's had generalized body aches. She also feels short of breath. She has soreness in her chest, worse when she coughs or breathes. Her cough has become productive of yellow sputum. She states she's been nauseated and vomited 5 times today. She is not nauseated at the present time. States she feels just like she did a year ago when she had pneumonia. She is not taking any medication for any of these symptoms.  Past Medical History  Diagnosis Date  . Pneumonia   . Acid reflux     History reviewed. No pertinent past surgical history.  History reviewed. No pertinent family history.  History  Substance Use Topics  . Smoking status: Never Smoker   . Smokeless tobacco: Never Used  . Alcohol Use: No    OB History    Grav Para Term Preterm Abortions TAB SAB Ect Mult Living                  Review of Systems  All other systems reviewed and are negative.    Allergies  Codeine and Penicillins  Home Medications   Current Outpatient Rx  Name  Route  Sig  Dispense  Refill  . ACETAMINOPHEN 500 MG PO TABS   Oral   Take 500 mg by mouth every 6 (six) hours as needed. For pain          . DIPHENHYDRAMINE HCL 25 MG PO TABS   Oral   Take 50 mg by mouth once. For allergic reaction          . NORGESTIM-ETH ESTRAD TRIPHASIC 0.18/0.215/0.25 MG-35 MCG PO TABS   Oral   Take 1 tablet by mouth daily.             BP 124/52  Pulse 116  Temp 98.9 F (37.2 C) (Oral)  Resp 20  Ht 5\' 5"  (1.651 m)  Wt 255 lb (115.667 kg)  BMI  42.43 kg/m2  SpO2 100%  LMP 07/22/2012  Physical Exam General: Well-developed, well-nourished female in no acute distress; appearance consistent with age of record HENT: normocephalic, atraumatic Eyes: pupils equal round and reactive to light; extraocular muscles intact Neck: supple Heart: regular rate and rhythm; no murmurs, rubs or gallops Lungs: clear to auscultation bilaterally; cough Abdomen: soft; obese; mild epigastric tenderness; bowel sounds present Extremities: No deformity; full range of motion; pulses normal; no edema Neurologic: Awake, alert and oriented; motor function intact in all extremities and symmetric; no facial droop Skin: Warm and dry Psychiatric: Normal mood and affect    ED Course  Procedures (including critical care time)     MDM   Nursing notes and vitals signs, including pulse oximetry, reviewed.  Summary of this visit's results, reviewed by myself:  Labs:  Results for orders placed during the hospital encounter of 08/13/12  PREGNANCY, URINE      Component Value Range   Preg Test, Ur NEGATIVE  NEGATIVE    Imaging Studies: Dg Chest 2 View  08/13/2012  *RADIOLOGY REPORT*  Clinical Data: Generalized body aches for 3  days; cold symptoms for 1 week.  CHEST - 2 VIEW  Comparison: Chest radiograph performed 09/14/2011  Findings: The lungs are well-aerated and clear.  There is no evidence of focal opacification, pleural effusion or pneumothorax.  The heart is normal in size; the mediastinal contour is within normal limits.  No acute osseous abnormalities are seen.  IMPRESSION: No acute cardiopulmonary process seen.   Original Report Authenticated By: Tonia Ghent, M.D.    We'll treat for bronchitis given patient's symptoms present for over a week.          Hanley Seamen, MD 08/14/12 [phone]

## 2012-08-14 MED ORDER — ALBUTEROL SULFATE HFA 108 (90 BASE) MCG/ACT IN AERS
2.0000 | INHALATION_SPRAY | RESPIRATORY_TRACT | Status: DC | PRN
  Administered 2012-08-14: 2 via RESPIRATORY_TRACT
  Filled 2012-08-14: qty 6.7

## 2012-08-14 MED ORDER — AZITHROMYCIN 250 MG PO TABS: 250.0000 mg | ORAL_TABLET | Freq: Every day | ORAL | Status: DC

## 2012-08-14 NOTE — Progress Notes (Signed)
RT explained the correct use of an MDI with a spacer.  Patient demonstrated correct technique of using a MDI with a spacer.

## 2012-08-14 NOTE — ED Notes (Signed)
MD at bedside. 

## 2012-09-03 ENCOUNTER — Emergency Department (HOSPITAL_BASED_OUTPATIENT_CLINIC_OR_DEPARTMENT_OTHER): Payer: Self-pay

## 2012-09-03 ENCOUNTER — Emergency Department (HOSPITAL_BASED_OUTPATIENT_CLINIC_OR_DEPARTMENT_OTHER)
Admission: EM | Admit: 2012-09-03 | Discharge: 2012-09-03 | Disposition: A | Discharge status: Home / Self Care | Payer: Self-pay | Attending: Emergency Medicine | Admitting: Emergency Medicine

## 2012-09-03 ENCOUNTER — Encounter (HOSPITAL_BASED_OUTPATIENT_CLINIC_OR_DEPARTMENT_OTHER): Payer: Self-pay | Admitting: Emergency Medicine

## 2012-09-03 DIAGNOSIS — R079 Chest pain, unspecified: Secondary | ICD-10-CM | POA: Insufficient documentation

## 2012-09-03 DIAGNOSIS — Z8701 Personal history of pneumonia (recurrent): Secondary | ICD-10-CM | POA: Insufficient documentation

## 2012-09-03 DIAGNOSIS — R0602 Shortness of breath: Secondary | ICD-10-CM | POA: Insufficient documentation

## 2012-09-03 DIAGNOSIS — K219 Gastro-esophageal reflux disease without esophagitis: Secondary | ICD-10-CM | POA: Insufficient documentation

## 2012-09-03 DIAGNOSIS — J3489 Other specified disorders of nose and nasal sinuses: Secondary | ICD-10-CM | POA: Insufficient documentation

## 2012-09-03 DIAGNOSIS — Z79899 Other long term (current) drug therapy: Secondary | ICD-10-CM | POA: Insufficient documentation

## 2012-09-03 DIAGNOSIS — B349 Viral infection, unspecified: Secondary | ICD-10-CM

## 2012-09-03 DIAGNOSIS — J029 Acute pharyngitis, unspecified: Secondary | ICD-10-CM | POA: Insufficient documentation

## 2012-09-03 DIAGNOSIS — B9789 Other viral agents as the cause of diseases classified elsewhere: Secondary | ICD-10-CM | POA: Insufficient documentation

## 2012-09-03 DIAGNOSIS — R509 Fever, unspecified: Secondary | ICD-10-CM | POA: Insufficient documentation

## 2012-09-03 LAB — RAPID STREP SCREEN (MED CTR MEBANE ONLY): Streptococcus, Group A Screen (Direct): NEGATIVE

## 2012-09-03 MED ORDER — HYDROCODONE-ACETAMINOPHEN 5-325 MG PO TABS: 1.0000 | ORAL_TABLET | ORAL | Status: DC | PRN

## 2012-09-03 NOTE — ED Notes (Signed)
Pt sts she was here for cough, body aches, congestion, fever, and chills 2 weeks ago; continues to have same complaints. Needs work note to return to work.

## 2012-09-03 NOTE — ED Provider Notes (Signed)
Medical screening examination/treatment/procedure(s) were performed by non-physician practitioner and as supervising physician I was immediately available for consultation/collaboration.   Haidy Kackley W. Kailene Steinhart, MD 09/03/12 2056 

## 2012-09-03 NOTE — ED Provider Notes (Signed)
History     CSN: [id number]  Arrival date & time 09/03/12  1842   First MD Initiated Contact with Patient 09/03/12 1903      Chief Complaint  Patient presents with  . Cough    (Consider location/radiation/quality/duration/timing/severity/associated sxs/prior treatment) HPI History provided by pt and prior chart.  Pt c/o cough productive of yellow sputum x 1 month.  Per prior chart, seen for same in ED on 08/13/12, CXR neg and diagnosed w/ bronchitis.  Pt reports that cough has not improved.  It is associated w/ fever (max temp 99.0), sinus pressure, nasal congestion, rhinorrhea, sore throat, diffuse chest pain, SOB when laying flat,  N/V/D, body aches.  Denies abdominal pain and urinary sx.  Denies peripheral edema  No known sick contacts.  No PMH.   Past Medical History  Diagnosis Date  . Pneumonia   . Acid reflux     History reviewed. No pertinent past surgical history.  No family history on file.  History  Substance Use Topics  . Smoking status: Never Smoker   . Smokeless tobacco: Never Used  . Alcohol Use: No    OB History    Grav Para Term Preterm Abortions TAB SAB Ect Mult Living                  Review of Systems  All other systems reviewed and are negative.    Allergies  Codeine and Penicillins  Home Medications   Current Outpatient Rx  Name  Route  Sig  Dispense  Refill  . ACETAMINOPHEN 500 MG PO TABS   Oral   Take 500 mg by mouth every 6 (six) hours as needed. For pain          . DIPHENHYDRAMINE HCL 25 MG PO TABS   Oral   Take 50 mg by mouth once. For allergic reaction          . NORGESTIM-ETH ESTRAD TRIPHASIC 0.18/0.215/0.25 MG-35 MCG PO TABS   Oral   Take 1 tablet by mouth daily.             BP 131/84  Pulse 68  Temp 98.1 F (36.7 C) (Oral)  Resp 20  Ht 5\' 5"  (1.651 m)  Wt 255 lb (115.667 kg)  BMI 42.43 kg/m2  SpO2 99%  LMP 08/22/2012  Physical Exam  Nursing note and vitals reviewed. Constitutional: She is oriented to  person, place, and time. She appears well-developed and well-nourished. No distress.  HENT:  Head: Normocephalic and atraumatic. No trismus in the jaw.  Mouth/Throat: Uvula is midline and mucous membranes are normal. No posterior oropharyngeal edema or posterior oropharyngeal erythema.       Left and right TM appear nml.  Erythema of posterior pharynx and soft palate. Tonsils symmetric but enlarged and w/ exudate.  Uvula mid-line.  No trismus.  Nasal discharge.   Eyes:       Normal appearance  Neck: Normal range of motion. Neck supple.  Cardiovascular: Normal rate and regular rhythm.   Pulmonary/Chest: Effort normal and breath sounds normal.  Musculoskeletal: Normal range of motion.  Lymphadenopathy:    She has no cervical adenopathy.  Neurological: She is alert and oriented to person, place, and time.  Skin: Skin is warm and dry. No rash noted.  Psychiatric: She has a normal mood and affect. Her behavior is normal.    ED Course  Procedures (including critical care time)   Labs Reviewed  RAPID STREP SCREEN   Dg Chest 2  View  09/03/2012  *RADIOLOGY REPORT*  Clinical Data: Cough and congestion.  CHEST - 2 VIEW  Comparison: 08/13/2012.  Findings: Lung volumes are normal.  No consolidative airspace disease.  No pleural effusions.  No pneumothorax.  No pulmonary nodule or mass noted.  Pulmonary vasculature and the cardiomediastinal silhouette are within normal limits.  IMPRESSION: 1. No radiographic evidence of acute cardiopulmonary disease.   Original Report Authenticated By: Trudie Reed, M.D.      1. Viral syndrome       MDM  21yo F diagnosed w/ bronchitis, CXR neg in ED on 08/13/2012.  Returns w/ persistent cough + sore throat, N/V/D.  CXR repeated and neg.  Strep screen obtained and neg.  Will treat symptomatically for viral URI.  She did not have relief w/ albuterol inhaler.  Will prescribe vicodin for cough suppression and recommend sudafed, motrin, rest and fluids.  She has  a PCP to f/u with.  Return precautions discussed. 8:05 PM         Otilio Miu, PA 09/03/12 2005

## 2012-11-06 ENCOUNTER — Encounter (HOSPITAL_BASED_OUTPATIENT_CLINIC_OR_DEPARTMENT_OTHER): Payer: Self-pay | Admitting: *Deleted

## 2012-11-06 ENCOUNTER — Emergency Department (HOSPITAL_BASED_OUTPATIENT_CLINIC_OR_DEPARTMENT_OTHER)
Admission: EM | Admit: 2012-11-06 | Discharge: 2012-11-06 | Disposition: A | Discharge status: Home / Self Care | Payer: Self-pay | Attending: Emergency Medicine | Admitting: Emergency Medicine

## 2012-11-06 DIAGNOSIS — Z8701 Personal history of pneumonia (recurrent): Secondary | ICD-10-CM | POA: Insufficient documentation

## 2012-11-06 DIAGNOSIS — H6690 Otitis media, unspecified, unspecified ear: Secondary | ICD-10-CM | POA: Insufficient documentation

## 2012-11-06 DIAGNOSIS — J329 Chronic sinusitis, unspecified: Secondary | ICD-10-CM | POA: Insufficient documentation

## 2012-11-06 DIAGNOSIS — Z8719 Personal history of other diseases of the digestive system: Secondary | ICD-10-CM | POA: Insufficient documentation

## 2012-11-06 DIAGNOSIS — H6691 Otitis media, unspecified, right ear: Secondary | ICD-10-CM

## 2012-11-06 MED ORDER — CLINDAMYCIN HCL 150 MG PO CAPS: 150.0000 mg | ORAL_CAPSULE | Freq: Four times a day (QID) | ORAL | Status: DC

## 2012-11-06 MED ORDER — AZITHROMYCIN 250 MG PO TABS: 250.0000 mg | ORAL_TABLET | Freq: Every day | ORAL | Status: DC

## 2012-11-06 NOTE — ED Notes (Signed)
MD at bedside. 

## 2012-11-06 NOTE — ED Provider Notes (Signed)
History  This chart was scribed for Jamie Gaskins, MD by Shari Heritage, ED Scribe. The patient was seen in room MH04/MH04. Patient's care was started at 2305.  CSN: [id number]  Arrival date & time 11/06/12  2144   First MD Initiated Contact with Patient 11/06/12 2305      Chief Complaint  Patient presents with  . Otalgia  . Nasal Congestion     Patient is a 22 y.o. female presenting with ear pain. The history is provided by the patient. No language interpreter was used.  Otalgia This is a new problem. The current episode started 2 days ago. There is pain in the right ear. The problem occurs constantly. The problem has not changed since onset.There has been no fever. The pain is moderate. Pertinent negatives include no ear discharge and no cough. Her past medical history does not include chronic ear infection.    HPI Comments: Jamie Archer is a 22 y.o. female who presents to the Emergency Department complaining of moderate, constant, non-radiating right ear pain and fullness onset 2 days ago. Patient is also complaining of mild nasal congestion and sinus pressure. Patient denies significant facial pain. She denies fever, postnasal drip, ear drainage, shortness of breath or cough. Patient has taken Robitussin at home for symptom relief. Patient denies any significant chronic medical conditions. Patietn does not smoke.  Past Medical History  Diagnosis Date  . Pneumonia   . Acid reflux     History reviewed. No pertinent past surgical history.  History reviewed. No pertinent family history.  History  Substance Use Topics  . Smoking status: Never Smoker   . Smokeless tobacco: Never Used  . Alcohol Use: No    OB History    Grav Para Term Preterm Abortions TAB SAB Ect Mult Living                  Review of Systems  Constitutional: Negative for fever.  HENT: Positive for ear pain, congestion and sinus pressure. Negative for postnasal drip and ear discharge.   Respiratory:  Negative for cough and shortness of breath.     Allergies  Codeine and Penicillins  Home Medications   Current Outpatient Rx  Name  Route  Sig  Dispense  Refill  . ACETAMINOPHEN 500 MG PO TABS   Oral   Take 500 mg by mouth every 6 (six) hours as needed. For pain          . DIPHENHYDRAMINE HCL 25 MG PO TABS   Oral   Take 50 mg by mouth once. For allergic reaction          . HYDROCODONE-ACETAMINOPHEN 5-325 MG PO TABS   Oral   Take 1 tablet by mouth every 4 (four) hours as needed for pain.   20 tablet   0   . NORGESTIM-ETH ESTRAD TRIPHASIC 0.18/0.215/0.25 MG-35 MCG PO TABS   Oral   Take 1 tablet by mouth daily.             Triage Vitals: BP 147/78  Pulse 66  Temp 97.8 F (36.6 C) (Oral)  Resp 16  Ht 5\' 5"  (1.651 m)  Wt 250 lb (113.399 kg)  BMI 41.60 kg/m2  SpO2 100%  LMP 10/21/2012  Physical Exam CONSTITUTIONAL: Well developed/well nourished HEAD AND FACE: Normocephalic/atraumatic EYES: EOMI/PERRL ENMT: Mucous membranes moist, nasal congestion, right TM with effusion and erythema noted. NECK: supple no meningeal signs CV: S1/S2 noted, no murmurs/rubs/gallops noted LUNGS: Lungs are clear to auscultation bilaterally,  no apparent distress ABDOMEN: soft, nontender, no rebound or guarding GU:no cva tenderness NEURO: Pt is awake/alert, moves all extremitiesx4 EXTREMITIES: pulses normal, full ROM SKIN: warm, color normal PSYCH: no abnormalities of mood noted  ED Course  Procedures DIAGNOSTIC STUDIES: Oxygen Saturation is 100% on room air, normal by my interpretation.    COORDINATION OF CARE: 11:22 PM- Patient informed of current plan for treatment and evaluation and agrees with plan at this time.    1. Sinusitis   2. Otitis media of right ear       MDM  Nursing notes including past medical history and social history reviewed and considered in documentation       I personally performed the services described in this documentation, which was  scribed in my presence. The recorded information has been reviewed and is accurate.      Jamie Gaskins, MD 11/06/12 [phone]

## 2012-11-06 NOTE — ED Notes (Signed)
Pt c/o sinus congestion and facial pain x 2 days

## 2012-12-14 ENCOUNTER — Encounter (HOSPITAL_BASED_OUTPATIENT_CLINIC_OR_DEPARTMENT_OTHER): Payer: Self-pay

## 2012-12-14 ENCOUNTER — Emergency Department (HOSPITAL_BASED_OUTPATIENT_CLINIC_OR_DEPARTMENT_OTHER)
Admission: EM | Admit: 2012-12-14 | Discharge: 2012-12-14 | Disposition: A | Discharge status: Home / Self Care | Payer: Self-pay | Attending: Emergency Medicine | Admitting: Emergency Medicine

## 2012-12-14 DIAGNOSIS — Z8719 Personal history of other diseases of the digestive system: Secondary | ICD-10-CM | POA: Insufficient documentation

## 2012-12-14 DIAGNOSIS — Z8701 Personal history of pneumonia (recurrent): Secondary | ICD-10-CM | POA: Insufficient documentation

## 2012-12-14 DIAGNOSIS — G43909 Migraine, unspecified, not intractable, without status migrainosus: Secondary | ICD-10-CM | POA: Insufficient documentation

## 2012-12-14 DIAGNOSIS — R112 Nausea with vomiting, unspecified: Secondary | ICD-10-CM | POA: Insufficient documentation

## 2012-12-14 HISTORY — DX: Migraine, unspecified, not intractable, without status migrainosus: G43.909

## 2012-12-14 MED ORDER — KETOROLAC TROMETHAMINE 60 MG/2ML IM SOLN
60.0000 mg | Freq: Once | INTRAMUSCULAR | Status: AC
  Administered 2012-12-14: 60 mg via INTRAMUSCULAR
  Filled 2012-12-14: qty 2

## 2012-12-14 MED ORDER — DIPHENHYDRAMINE HCL 50 MG/ML IJ SOLN
25.0000 mg | Freq: Once | INTRAMUSCULAR | Status: AC
  Administered 2012-12-14: 25 mg via INTRAMUSCULAR
  Filled 2012-12-14: qty 1

## 2012-12-14 MED ORDER — METOCLOPRAMIDE HCL 5 MG/ML IJ SOLN
10.0000 mg | Freq: Once | INTRAMUSCULAR | Status: AC
  Administered 2012-12-14: 10 mg via INTRAMUSCULAR
  Filled 2012-12-14: qty 2

## 2012-12-14 NOTE — ED Notes (Signed)
D/c home with ride 

## 2012-12-14 NOTE — ED Provider Notes (Signed)
History     CSN: [id number]  Arrival date & time 12/14/12  2057   First MD Initiated Contact with Patient 12/14/12 2112      Chief Complaint  Patient presents with  . Migraine    (Consider location/radiation/quality/duration/timing/severity/associated sxs/prior treatment) HPI Comments: Patient presents to the ER for evaluation of migraine headache. Patient reports that she has a history of recurrent migraines. Patient has had a headache for 4 days. Patient reports a bitemporal throbbing headache associated with light sensitivity, sound sensitivity nausea and vomiting. She reports that the headache is similar to previous migraines that she has had. No fever, neck pain or neck stiffness. Headache came on slowly and progressively worsened, no sudden thunderclap headache.  Patient is a 22 y.o. female presenting with migraines.  Migraine Associated symptoms include headaches.    Past Medical History  Diagnosis Date  . Pneumonia   . Acid reflux   . Migraine     History reviewed. No pertinent past surgical history.  No family history on file.  History  Substance Use Topics  . Smoking status: Never Smoker   . Smokeless tobacco: Never Used  . Alcohol Use: No    OB History   Grav Para Term Preterm Abortions TAB SAB Ect Mult Living                  Review of Systems  Constitutional: Negative for fever.  HENT: Negative for neck pain and neck stiffness.   Gastrointestinal: Positive for nausea and vomiting.  Neurological: Positive for headaches.  All other systems reviewed and are negative.    Allergies  Zithromax; Codeine; and Penicillins  Home Medications   Current Outpatient Rx  Name  Route  Sig  Dispense  Refill  . clindamycin (CLEOCIN) 150 MG capsule   Oral   Take 1 capsule (150 mg total) by mouth every 6 (six) hours.   28 capsule   0   . Norgestimate-Ethinyl Estradiol Triphasic (ORTHO TRI-CYCLEN, 28,) 0.18/0.215/0.25 MG-35 MCG tablet   Oral   Take 1 tablet  by mouth daily.             BP 154/67  Pulse 72  Temp(Src) 97.9 F (36.6 C) (Oral)  Resp 16  Ht 5\' 4"  (1.626 m)  Wt 285 lb (129.275 kg)  BMI 48.9 kg/m2  SpO2 99%  LMP 10/21/2012  Physical Exam  Constitutional: She is oriented to person, place, and time. She appears well-developed and well-nourished. No distress.  HENT:  Head: Normocephalic and atraumatic.  Right Ear: Hearing normal.  Nose: Nose normal.  Mouth/Throat: Oropharynx is clear and moist and mucous membranes are normal.  Eyes: Conjunctivae and EOM are normal. Pupils are equal, round, and reactive to light.  Neck: Normal range of motion. Neck supple.  Cardiovascular: Normal rate, regular rhythm, S1 normal and S2 normal.  Exam reveals no gallop and no friction rub.   No murmur heard. Pulmonary/Chest: Effort normal and breath sounds normal. No respiratory distress. She exhibits no tenderness.  Abdominal: Soft. Normal appearance and bowel sounds are normal. There is no hepatosplenomegaly. There is no tenderness. There is no rebound, no guarding, no tenderness at McBurney's point and negative Murphy's sign. No hernia.  Musculoskeletal: Normal range of motion.  Neurological: She is alert and oriented to person, place, and time. She has normal strength. No cranial nerve deficit or sensory deficit. Coordination normal. GCS eye subscore is 4. GCS verbal subscore is 5. GCS motor subscore is 6.  Skin: Skin is  warm, dry and intact. No rash noted. No cyanosis.  Psychiatric: She has a normal mood and affect. Her speech is normal and behavior is normal. Thought content normal.    ED Course  Procedures (including critical care time)  Labs Reviewed - No data to display No results found.   Diagnosis: Migraine headache    MDM  Patient presents to ER with migraine headache. Patient has a history of recurrent migraines with similar qualities. She does not feel that there are any unusual symptoms compared to previous migraines.  Headache was slow in onset, progressively worsening. Her neurologic exam is entirely normal. Because of the previous history of migraines with similar quality, no workup is necessary.        Gilda Crease, MD 12/14/12 2122

## 2012-12-14 NOTE — ED Notes (Signed)
C/o migraine HA x 4 days 

## 2012-12-14 NOTE — ED Notes (Signed)
MD at bedside. 

## 2012-12-18 ENCOUNTER — Emergency Department (HOSPITAL_BASED_OUTPATIENT_CLINIC_OR_DEPARTMENT_OTHER)
Admission: EM | Admit: 2012-12-18 | Discharge: 2012-12-18 | Disposition: A | Discharge status: Home / Self Care | Payer: Self-pay | Attending: Emergency Medicine | Admitting: Emergency Medicine

## 2012-12-18 ENCOUNTER — Encounter (HOSPITAL_BASED_OUTPATIENT_CLINIC_OR_DEPARTMENT_OTHER): Payer: Self-pay | Admitting: Family Medicine

## 2012-12-18 DIAGNOSIS — Z8719 Personal history of other diseases of the digestive system: Secondary | ICD-10-CM | POA: Insufficient documentation

## 2012-12-18 DIAGNOSIS — F17200 Nicotine dependence, unspecified, uncomplicated: Secondary | ICD-10-CM | POA: Insufficient documentation

## 2012-12-18 DIAGNOSIS — R51 Headache: Secondary | ICD-10-CM

## 2012-12-18 DIAGNOSIS — G43909 Migraine, unspecified, not intractable, without status migrainosus: Secondary | ICD-10-CM | POA: Insufficient documentation

## 2012-12-18 DIAGNOSIS — J020 Streptococcal pharyngitis: Secondary | ICD-10-CM | POA: Insufficient documentation

## 2012-12-18 DIAGNOSIS — Z8701 Personal history of pneumonia (recurrent): Secondary | ICD-10-CM | POA: Insufficient documentation

## 2012-12-18 DIAGNOSIS — J029 Acute pharyngitis, unspecified: Secondary | ICD-10-CM

## 2012-12-18 MED ORDER — HYDROCODONE-ACETAMINOPHEN 5-325 MG PO TABS
1.0000 | ORAL_TABLET | Freq: Once | ORAL | Status: AC
  Administered 2012-12-18: 1 via ORAL
  Filled 2012-12-18: qty 1

## 2012-12-18 MED ORDER — IBUPROFEN 400 MG PO TABS
600.0000 mg | ORAL_TABLET | Freq: Once | ORAL | Status: AC
  Administered 2012-12-18: 600 mg via ORAL
  Filled 2012-12-18: qty 1

## 2012-12-18 NOTE — ED Notes (Signed)
D/c home with ride 

## 2012-12-18 NOTE — ED Notes (Signed)
Pt c/o sore throat x 1 day and migraine x 1 wk. Pt sts she was seen and treated here 4 days ago for migraine.

## 2012-12-18 NOTE — ED Provider Notes (Signed)
History     CSN: [id number]  Arrival date & time 12/18/12  1154   First MD Initiated Contact with Patient 12/18/12 1243      Chief Complaint  Patient presents with  . Sore Throat  . Migraine    (Consider location/radiation/quality/duration/timing/severity/associated sxs/prior treatment) Patient is a 22 y.o. female presenting with pharyngitis and migraines. The history is provided by the patient.  Sore Throat Pertinent negatives include no chest pain, no abdominal pain, no headaches and no shortness of breath.  Migraine Pertinent negatives include no chest pain, no abdominal pain, no headaches and no shortness of breath.  pt c/o sore throat for past day. Dull, moderate, bil. No unilateral throat pain or swelling. No trouble breathing or swallowing. No change in voice. Denies nasal congestion or purulent drainage. No sinus pain. Does note dull frontal headache, mild, c/w prior headaches. Hx migranies. Denies any severe head pain. No nv. No eye pain or change in vision. No numbness/weaknesss. No neck pain or stiffness. Denies fever/chills. No known ill contacts or known strep or mono exposure.      Past Medical History  Diagnosis Date  . Pneumonia   . Acid reflux   . Migraine     History reviewed. No pertinent past surgical history.  No family history on file.  History  Substance Use Topics  . Smoking status: Current Every Day Smoker  . Smokeless tobacco: Never Used  . Alcohol Use: Yes    OB History   Grav Para Term Preterm Abortions TAB SAB Ect Mult Living                  Review of Systems  Constitutional: Negative for fever and chills.  HENT: Positive for sore throat. Negative for neck pain.   Eyes: Negative for redness.  Respiratory: Negative for cough and shortness of breath.   Cardiovascular: Negative for chest pain.  Gastrointestinal: Negative for vomiting, abdominal pain and diarrhea.  Genitourinary: Negative for flank pain.  Musculoskeletal: Negative  for back pain.  Skin: Negative for rash.  Neurological: Negative for headaches.  Hematological: Does not bruise/bleed easily.  Psychiatric/Behavioral: Negative for confusion.    Allergies  Zithromax; Codeine; and Penicillins  Home Medications   Current Outpatient Rx  Name  Route  Sig  Dispense  Refill  . clindamycin (CLEOCIN) 150 MG capsule   Oral   Take 1 capsule (150 mg total) by mouth every 6 (six) hours.   28 capsule   0   . Norgestimate-Ethinyl Estradiol Triphasic (ORTHO TRI-CYCLEN, 28,) 0.18/0.215/0.25 MG-35 MCG tablet   Oral   Take 1 tablet by mouth daily.             BP 171/70  Pulse 60  Temp(Src) 98.8 F (37.1 C) (Oral)  Resp 20  Ht 5\' 6"  (1.676 m)  Wt 235 lb (106.595 kg)  BMI 37.95 kg/m2  SpO2 98%  LMP 10/21/2012  Physical Exam  Nursing note and vitals reviewed. Constitutional: She appears well-developed and well-nourished. No distress.  HENT:  Nose: Nose normal.  Mouth/Throat: Oropharynx is clear and moist.  No sinus or temporal tenderness.  Pharynx mildly erythematous, no exudate, no asymmetric swelling or abscess. No trismus.   Eyes: Conjunctivae are normal. Pupils are equal, round, and reactive to light. No scleral icterus.  Neck: Normal range of motion. Neck supple. No tracheal deviation present.  No stiffness or rigidity  Cardiovascular: Normal rate.   Pulmonary/Chest: Effort normal. No respiratory distress.  Abdominal: Normal appearance. She exhibits  no distension.  Musculoskeletal: She exhibits no edema.  Lymphadenopathy:    She has no cervical adenopathy.  Neurological: She is alert.  Skin: Skin is warm and dry. No rash noted. She is not diaphoretic.  Psychiatric: She has a normal mood and affect.    ED Course  Procedures (including critical care time)  Results for orders placed during the hospital encounter of 12/18/12  RAPID STREP SCREEN      Result Value Range   Streptococcus, Group A Screen (Direct) NEGATIVE  NEGATIVE         MDM  No meds pta. Pt has ride, does not have to drive.  vicodin po, motrin po. Strep screen.  Reviewed nursing notes and prior charts for additional history.   Recheck pt comfortable. Appears stable for d/c.         Suzi Roots, MD 12/18/12 1330

## 2012-12-26 ENCOUNTER — Emergency Department (HOSPITAL_BASED_OUTPATIENT_CLINIC_OR_DEPARTMENT_OTHER): Payer: Self-pay

## 2012-12-26 ENCOUNTER — Encounter (HOSPITAL_BASED_OUTPATIENT_CLINIC_OR_DEPARTMENT_OTHER): Payer: Self-pay | Admitting: *Deleted

## 2012-12-26 ENCOUNTER — Emergency Department (HOSPITAL_BASED_OUTPATIENT_CLINIC_OR_DEPARTMENT_OTHER)
Admission: EM | Admit: 2012-12-26 | Discharge: 2012-12-26 | Disposition: A | Discharge status: Home / Self Care | Payer: Self-pay | Attending: Emergency Medicine | Admitting: Emergency Medicine

## 2012-12-26 DIAGNOSIS — Z8719 Personal history of other diseases of the digestive system: Secondary | ICD-10-CM | POA: Insufficient documentation

## 2012-12-26 DIAGNOSIS — Z8701 Personal history of pneumonia (recurrent): Secondary | ICD-10-CM | POA: Insufficient documentation

## 2012-12-26 DIAGNOSIS — Z3202 Encounter for pregnancy test, result negative: Secondary | ICD-10-CM | POA: Insufficient documentation

## 2012-12-26 DIAGNOSIS — Z8679 Personal history of other diseases of the circulatory system: Secondary | ICD-10-CM | POA: Insufficient documentation

## 2012-12-26 DIAGNOSIS — F17200 Nicotine dependence, unspecified, uncomplicated: Secondary | ICD-10-CM | POA: Insufficient documentation

## 2012-12-26 DIAGNOSIS — R079 Chest pain, unspecified: Secondary | ICD-10-CM | POA: Insufficient documentation

## 2012-12-26 LAB — CBC WITH DIFFERENTIAL/PLATELET
Basophils Relative: 0 % (ref 0–1)
Eosinophils Absolute: 0.2 10*3/uL (ref 0.0–0.7)
Eosinophils Relative: 2 % (ref 0–5)
Lymphs Abs: 2.2 10*3/uL (ref 0.7–4.0)
MCH: 29.6 pg (ref 26.0–34.0)
MCHC: 32.9 g/dL (ref 30.0–36.0)
MCV: 90.2 fL (ref 78.0–100.0)
Platelets: 235 10*3/uL (ref 150–400)
RBC: 3.88 MIL/uL (ref 3.87–5.11)
RDW: 13.1 % (ref 11.5–15.5)

## 2012-12-26 LAB — BASIC METABOLIC PANEL
Calcium: 9.2 mg/dL (ref 8.4–10.5)
GFR calc Af Amer: >90 mL/min (ref >90)
GFR calc non Af Amer: >90 mL/min (ref >90)
Glucose, Bld: 106 mg/dL — ABNORMAL HIGH (ref 70–99)
Sodium: 141 mEq/L (ref 135–145)

## 2012-12-26 LAB — TROPONIN I: Troponin I: <0.3 ng/mL (ref <0.30)

## 2012-12-26 MED ORDER — OXYCODONE-ACETAMINOPHEN 5-325 MG PO TABS: 2.0000 | ORAL_TABLET | ORAL | Status: DC | PRN

## 2012-12-26 MED ORDER — IBUPROFEN 800 MG PO TABS: 800.0000 mg | ORAL_TABLET | Freq: Three times a day (TID) | ORAL | Status: DC

## 2012-12-26 MED ORDER — KETOROLAC TROMETHAMINE 60 MG/2ML IM SOLN
60.0000 mg | Freq: Once | INTRAMUSCULAR | Status: AC
  Administered 2012-12-26: 60 mg via INTRAMUSCULAR
  Filled 2012-12-26: qty 2

## 2012-12-26 MED ORDER — KETOROLAC TROMETHAMINE 30 MG/ML IJ SOLN: 30.0000 mg | Freq: Once | INTRAMUSCULAR | Status: DC

## 2012-12-26 NOTE — ED Notes (Signed)
Sudden onset pain in the center of her chest. Feels like something is hitting her in the chest.

## 2012-12-26 NOTE — ED Notes (Signed)
Pt. Is in no distress.  Pt. Has no shortness of breath noted.

## 2012-12-26 NOTE — ED Provider Notes (Signed)
History     CSN: [id number]  Arrival date & time 12/26/12  2200   First MD Initiated Contact with Patient 12/26/12 2208      Chief Complaint  Patient presents with  . Chest Pain    (Consider location/radiation/quality/duration/timing/severity/associated sxs/prior treatment) The history is provided by the patient.  Jamie Archer is a 22 y.o. female history of acid reflux, migraine, current smoker here presenting with chest pain. Sudden onset of chest pain without an hour before arrival. She was resting and suddenly happened. She felt sharp pain in the center of her chest felt like someone is hitting her in the chest. Denies any chest trauma or straining her muscles recently. No nausea vomiting or abdominal pain. Fevers or chills. No history of MI or stents. Just finished menses last week.    Past Medical History  Diagnosis Date  . Pneumonia   . Acid reflux   . Migraine     History reviewed. No pertinent past surgical history.  No family history on file.  History  Substance Use Topics  . Smoking status: Current Every Day Smoker  . Smokeless tobacco: Never Used  . Alcohol Use: Yes    OB History   Grav Para Term Preterm Abortions TAB SAB Ect Mult Living                  Review of Systems  Cardiovascular: Positive for chest pain.  All other systems reviewed and are negative.    Allergies  Zithromax; Codeine; and Penicillins  Home Medications   Current Outpatient Rx  Name  Route  Sig  Dispense  Refill  . clindamycin (CLEOCIN) 150 MG capsule   Oral   Take 1 capsule (150 mg total) by mouth every 6 (six) hours.   28 capsule   0   . Norgestimate-Ethinyl Estradiol Triphasic (ORTHO TRI-CYCLEN, 28,) 0.18/0.215/0.25 MG-35 MCG tablet   Oral   Take 1 tablet by mouth daily.             BP 124/95  Pulse 74  Temp(Src) 97.9 F (36.6 C) (Oral)  Resp 16  Wt 235 lb (106.595 kg)  BMI 37.95 kg/m2  SpO2 99%  LMP 12/15/2012  Physical Exam  Nursing note and vitals  reviewed. Constitutional: She is oriented to person, place, and time.  Obese, uncomfortable   HENT:  Head: Normocephalic.  Mouth/Throat: Oropharynx is clear and moist.  Eyes: Conjunctivae are normal. Pupils are equal, round, and reactive to light.  Neck: Normal range of motion. Neck supple.  Cardiovascular: Normal rate, regular rhythm and normal heart sounds.   Pulmonary/Chest: Effort normal and breath sounds normal. No respiratory distress. She has no wheezes. She has no rales.  ? Reproducible sternal tenderness   Abdominal: Soft. Bowel sounds are normal. She exhibits no distension. There is no tenderness. There is no rebound and no guarding.  Obese   Musculoskeletal: Normal range of motion. She exhibits no edema and no tenderness.  Neurological: She is alert and oriented to person, place, and time.  Skin: Skin is warm and dry.  Psychiatric: She has a normal mood and affect. Her behavior is normal. Judgment and thought content normal.    ED Course  Procedures (including critical care time)  Labs Reviewed  CBC WITH DIFFERENTIAL - Abnormal; Notable for the following:    Hemoglobin 11.5 (*)    HCT 35.0 (*)    All other components within normal limits  BASIC METABOLIC PANEL - Abnormal; Notable for the following:  Glucose, Bld 106 (*)    All other components within normal limits  PREGNANCY, URINE  TROPONIN I   Dg Chest 2 View  12/26/2012  *RADIOLOGY REPORT*  Clinical Data: Acute onset mid chest pain and shortness of breath. Smoker.  CHEST - 2 VIEW  Comparison: Two-view chest x-ray 09/03/2012, 08/13/2012, 09/14/2011.  Findings: Suboptimal inspiration due to body habitus which accounts for crowded bronchovascular markings at the bases and accentuates the cardiac silhouette.  Taking this into account, cardiomediastinal silhouette unremarkable and unchanged.   Lungs clear.  Bronchovascular markings normal.  Pulmonary vascularity normal.  No pneumothorax.  No pleural effusions.  Thoracic  scoliosis convex right compensatory thoracolumbar scoliosis convex left as noted previously.  IMPRESSION: Suboptimal inspiration.  No acute cardiopulmonary disease.   Original Report Authenticated By: Hulan Saas, M.D.      No diagnosis found.   Date: 12/26/2012  Rate: 83  Rhythm: normal sinus rhythm  QRS Axis: normal  Intervals: normal  ST/T Wave abnormalities: normal  Conduction Disutrbances:none  Narrative Interpretation:   Old EKG Reviewed: unchanged    MDM  Jamie Archer is a 22 y.o. female here with chest pain. Likely MSK in origin. Low risk for ACS, not concerned for PE. Given that she is smoker so will get cxr, trop x 1. Will give toradol and reassess.   11:14 PM Felt better. CXR nl. Labs nl, Trop neg x 1. Likely muscle strain. Will d/c home on motrin with percocet for break through pain.         Richardean Canal, MD 12/26/12 [phone]

## 2013-03-27 ENCOUNTER — Encounter (HOSPITAL_BASED_OUTPATIENT_CLINIC_OR_DEPARTMENT_OTHER): Payer: Self-pay

## 2013-03-27 ENCOUNTER — Emergency Department (HOSPITAL_BASED_OUTPATIENT_CLINIC_OR_DEPARTMENT_OTHER)
Admission: EM | Admit: 2013-03-27 | Discharge: 2013-03-28 | Disposition: A | Discharge status: Home / Self Care | Payer: Self-pay | Attending: Emergency Medicine | Admitting: Emergency Medicine

## 2013-03-27 DIAGNOSIS — R071 Chest pain on breathing: Secondary | ICD-10-CM | POA: Insufficient documentation

## 2013-03-27 DIAGNOSIS — R110 Nausea: Secondary | ICD-10-CM | POA: Insufficient documentation

## 2013-03-27 DIAGNOSIS — G43909 Migraine, unspecified, not intractable, without status migrainosus: Secondary | ICD-10-CM | POA: Insufficient documentation

## 2013-03-27 DIAGNOSIS — Z8701 Personal history of pneumonia (recurrent): Secondary | ICD-10-CM | POA: Insufficient documentation

## 2013-03-27 DIAGNOSIS — R0789 Other chest pain: Secondary | ICD-10-CM

## 2013-03-27 DIAGNOSIS — Z880 Allergy status to penicillin: Secondary | ICD-10-CM | POA: Insufficient documentation

## 2013-03-27 DIAGNOSIS — H53149 Visual discomfort, unspecified: Secondary | ICD-10-CM | POA: Insufficient documentation

## 2013-03-27 DIAGNOSIS — F17200 Nicotine dependence, unspecified, uncomplicated: Secondary | ICD-10-CM | POA: Insufficient documentation

## 2013-03-27 DIAGNOSIS — Z8719 Personal history of other diseases of the digestive system: Secondary | ICD-10-CM | POA: Insufficient documentation

## 2013-03-27 MED ORDER — DIPHENHYDRAMINE HCL 50 MG/ML IJ SOLN
25.0000 mg | Freq: Once | INTRAMUSCULAR | Status: AC
  Administered 2013-03-28: 25 mg via INTRAVENOUS
  Filled 2013-03-27: qty 1

## 2013-03-27 MED ORDER — SODIUM CHLORIDE 0.9 % IV BOLUS (SEPSIS)
1000.0000 mL | Freq: Once | INTRAVENOUS | Status: AC
  Administered 2013-03-28: 1000 mL via INTRAVENOUS

## 2013-03-27 MED ORDER — PROCHLORPERAZINE EDISYLATE 5 MG/ML IJ SOLN
10.0000 mg | Freq: Four times a day (QID) | INTRAMUSCULAR | Status: DC | PRN
  Filled 2013-03-27: qty 2

## 2013-03-27 MED ORDER — KETOROLAC TROMETHAMINE 30 MG/ML IJ SOLN
30.0000 mg | Freq: Once | INTRAMUSCULAR | Status: AC
  Administered 2013-03-28: 30 mg via INTRAVENOUS
  Filled 2013-03-27: qty 1

## 2013-03-27 NOTE — ED Notes (Signed)
MD at bedside. 

## 2013-03-27 NOTE — ED Provider Notes (Signed)
History     CSN: [id number]  Arrival date & time 03/27/13  2143   First MD Initiated Contact with Patient 03/27/13 2332      Chief Complaint  Patient presents with  . Chest Pain  . Migraine   HPI Thersa Mohiuddin is a 22 y.o. female with a history of acid reflux as well as migraines presents with a headache to the left side of her head that started on Saturday has been waxing and waning, gradually growing in intensity, was not maximal in onset, has been associated with photophobia and mild nausea, no vomiting, no numbness or tingling, it is described as a throbbing/hammering, has not been associated with any neurologic deficits. She's also complaining about some sharp left-sided chest pain, 3-4 cm inferior to the midclavicular border, she says it is sharp, constant, worse with she's sitting up her breathing, not associated with shortness of breath. She denies any hemoptysis, history of venous thromboembolic disease, recent immobilization, recent trauma. Patient is currently taking oral contraceptives.   Past Medical History  Diagnosis Date  . Pneumonia   . Acid reflux   . Migraine     History reviewed. No pertinent past surgical history.  No family history on file.  History  Substance Use Topics  . Smoking status: Current Every Day Smoker  . Smokeless tobacco: Never Used  . Alcohol Use: Yes    OB History   Grav Para Term Preterm Abortions TAB SAB Ect Mult Living                  Review of Systems At least 10pt or greater review of systems completed and are negative except where specified in the HPI.  Allergies  Zithromax; Codeine; and Penicillins  Home Medications   Current Outpatient Rx  Name  Route  Sig  Dispense  Refill  . clindamycin (CLEOCIN) 150 MG capsule   Oral   Take 1 capsule (150 mg total) by mouth every 6 (six) hours.   28 capsule   0   . ibuprofen (ADVIL,MOTRIN) 800 MG tablet   Oral   Take 1 tablet (800 mg total) by mouth 3 (three) times daily.  21 tablet   0   . Norgestimate-Ethinyl Estradiol Triphasic (ORTHO TRI-CYCLEN, 28,) 0.18/0.215/0.25 MG-35 MCG tablet   Oral   Take 1 tablet by mouth daily.           Marland Kitchen oxyCODONE-acetaminophen (PERCOCET) 5-325 MG per tablet   Oral   Take 2 tablets by mouth every 4 (four) hours as needed for pain.   6 tablet   0     BP 107/84  Pulse 79  Temp(Src) 99 F (37.2 C) (Oral)  Resp 16  Ht 5\' 4"  (1.626 m)  Wt 245 lb (111.131 kg)  BMI 42.03 kg/m2  Physical Exam  Nursing notes reviewed.  Electronic medical record reviewed. VITAL SIGNS:   Filed Vitals:   03/27/13 2156  BP: 107/84  Pulse: 79  Temp: 99 F (37.2 C)  TempSrc: Oral  Resp: 16  Height: 5\' 4"  (1.626 m)  Weight: 245 lb (111.131 kg)   CONSTITUTIONAL: Awake, oriented, appears non-toxic but uncomfortable, lights are off in the room HENT: Atraumatic, normocephalic, oral mucosa pink and moist, airway patent. Nares patent without drainage. External ears normal. EYES: Conjunctiva clear, EOMI, PERRLA NECK: Trachea midline, non-tender, supple CARDIOVASCULAR: Normal heart rate, Normal rhythm, No murmurs, rubs, gallops PULMONARY/CHEST: Clear to auscultation, no rhonchi, wheezes, or rales. Symmetrical breath sounds. Reproducible chest pain indicated, pressing  on this area especially with the left pectoral muscles flexed causes sharp pain.. ABDOMINAL: Non-distended, morbidly obese, soft, non-tender - no rebound or guarding.  BS normal. NEUROLOGIC: Cranial nerves are normal. Non-focal, moving all four extremities, no gross sensory or motor deficits. EXTREMITIES: No clubbing, cyanosis, or edema SKIN: Warm, Dry, No erythema, No rash  ED Course  Procedures (including critical care time)  Date: 03/28/2013  Rate: 61  Rhythm: normal sinus rhythm  QRS Axis: normal  Intervals: normal  ST/T Wave abnormalities: normal  Conduction Disutrbances: none  Narrative Interpretation: 1 PAC, no significant change compared with prior EKG from  12/26/2012. Nonischemic EKG.   Labs Reviewed - No data to display No results found.   1. Migraine   2. Chest wall pain       MDM  Patient presenting with typical migraine headache, not relieved with home medications or rest. Patient says she has seen her physician about this in the past and has been a question whether or not her Ortho Tri-Cyclen has been causing her headaches. Patient also complaining about chest pain which is reproducible, sharp and seems most consistent with muscular wall chest pain especially at as it is worse in the pectoral muscles flexed and area is palpated. Doubt PE, patient Wells score is a 0, w/ low clinical suspicion.  Treated headache with Reglan, Benadryl, Toradol and 1 L of fluid and the patient's severe headache 9-10 out of 10 reduced to 210 she wished to go home. Patient will be given some Compazine for home use she is urged to use Benadryl to avoid akathisia and dystonia which she has been warned about.  Patient to followup with primary care physician in discussion about changing birth control and headaches.        Jones Skene, MD 03/28/13 1324

## 2013-03-27 NOTE — ED Notes (Signed)
C/o central CP and "migraine" x 4 days

## 2013-03-28 MED ORDER — DIPHENHYDRAMINE HCL 25 MG PO CAPS: 25.0000 mg | ORAL_CAPSULE | Freq: Four times a day (QID) | ORAL | Status: DC | PRN

## 2013-03-28 MED ORDER — METOCLOPRAMIDE HCL 5 MG/ML IJ SOLN
INTRAMUSCULAR | Status: AC
  Administered 2013-03-28: 10 mg via INTRAVENOUS
  Filled 2013-03-28: qty 2

## 2013-03-28 MED ORDER — METOCLOPRAMIDE HCL 5 MG/ML IJ SOLN
10.0000 mg | Freq: Once | INTRAMUSCULAR | Status: AC
  Administered 2013-03-28: 10 mg via INTRAVENOUS

## 2013-03-28 MED ORDER — PROCHLORPERAZINE MALEATE 10 MG PO TABS: 10.0000 mg | ORAL_TABLET | Freq: Two times a day (BID) | ORAL | Status: DC | PRN

## 2013-05-14 ENCOUNTER — Emergency Department (HOSPITAL_BASED_OUTPATIENT_CLINIC_OR_DEPARTMENT_OTHER): Payer: Self-pay

## 2013-05-14 ENCOUNTER — Encounter (HOSPITAL_BASED_OUTPATIENT_CLINIC_OR_DEPARTMENT_OTHER): Payer: Self-pay | Admitting: *Deleted

## 2013-05-14 ENCOUNTER — Emergency Department (HOSPITAL_BASED_OUTPATIENT_CLINIC_OR_DEPARTMENT_OTHER)
Admission: EM | Admit: 2013-05-14 | Discharge: 2013-05-14 | Disposition: A | Discharge status: Home / Self Care | Payer: Self-pay | Attending: Emergency Medicine | Admitting: Emergency Medicine

## 2013-05-14 DIAGNOSIS — S335XXA Sprain of ligaments of lumbar spine, initial encounter: Secondary | ICD-10-CM | POA: Insufficient documentation

## 2013-05-14 DIAGNOSIS — N390 Urinary tract infection, site not specified: Secondary | ICD-10-CM | POA: Insufficient documentation

## 2013-05-14 DIAGNOSIS — W010XXA Fall on same level from slipping, tripping and stumbling without subsequent striking against object, initial encounter: Secondary | ICD-10-CM | POA: Insufficient documentation

## 2013-05-14 DIAGNOSIS — Y9389 Activity, other specified: Secondary | ICD-10-CM | POA: Insufficient documentation

## 2013-05-14 DIAGNOSIS — Z79899 Other long term (current) drug therapy: Secondary | ICD-10-CM | POA: Insufficient documentation

## 2013-05-14 DIAGNOSIS — Z3202 Encounter for pregnancy test, result negative: Secondary | ICD-10-CM | POA: Insufficient documentation

## 2013-05-14 DIAGNOSIS — S39012A Strain of muscle, fascia and tendon of lower back, initial encounter: Secondary | ICD-10-CM

## 2013-05-14 DIAGNOSIS — Z8719 Personal history of other diseases of the digestive system: Secondary | ICD-10-CM | POA: Insufficient documentation

## 2013-05-14 DIAGNOSIS — Z880 Allergy status to penicillin: Secondary | ICD-10-CM | POA: Insufficient documentation

## 2013-05-14 DIAGNOSIS — Z87891 Personal history of nicotine dependence: Secondary | ICD-10-CM | POA: Insufficient documentation

## 2013-05-14 DIAGNOSIS — Z8701 Personal history of pneumonia (recurrent): Secondary | ICD-10-CM | POA: Insufficient documentation

## 2013-05-14 DIAGNOSIS — Y929 Unspecified place or not applicable: Secondary | ICD-10-CM | POA: Insufficient documentation

## 2013-05-14 DIAGNOSIS — G43909 Migraine, unspecified, not intractable, without status migrainosus: Secondary | ICD-10-CM | POA: Insufficient documentation

## 2013-05-14 LAB — URINE MICROSCOPIC-ADD ON

## 2013-05-14 LAB — URINALYSIS, ROUTINE W REFLEX MICROSCOPIC
Bilirubin Urine: NEGATIVE
Glucose, UA: NEGATIVE mg/dL
Hgb urine dipstick: NEGATIVE
Ketones, ur: NEGATIVE mg/dL
Protein, ur: NEGATIVE mg/dL

## 2013-05-14 LAB — PREGNANCY, URINE: Preg Test, Ur: NEGATIVE

## 2013-05-14 MED ORDER — CIPROFLOXACIN HCL 500 MG PO TABS: 500.0000 mg | ORAL_TABLET | Freq: Two times a day (BID) | ORAL | Status: DC

## 2013-05-14 MED ORDER — TRAMADOL HCL 50 MG PO TABS: 50.0000 mg | ORAL_TABLET | Freq: Four times a day (QID) | ORAL | Status: DC | PRN

## 2013-05-14 NOTE — ED Notes (Signed)
MD at bedside. 

## 2013-05-14 NOTE — ED Provider Notes (Signed)
CSN: [id number]     Arrival date & time 05/14/13  0820 History     First MD Initiated Contact with Patient 05/14/13 [phone]     Chief Complaint  Patient presents with  . Back Pain   (Consider location/radiation/quality/duration/timing/severity/associated sxs/prior Treatment) HPI Comments: Patient presents with complaints of back pain after falling two days ago.  The fall was from standing height and she landed on her hip.  There is radiation to the legs and there are no bowel or bladder complaints.   Patient is a 22 y.o. female presenting with back pain. The history is provided by the patient.  Back Pain Location:  Lumbar spine Quality:  Stabbing Radiates to:  Does not radiate Pain severity:  Moderate Pain is:  Same all the time Onset quality:  Sudden Duration:  3 days Timing:  Constant Progression:  Worsening Chronicity:  New Context: falling   Relieved by:  Nothing Worsened by:  Bending, palpation and twisting Ineffective treatments:  None tried   Past Medical History  Diagnosis Date  . Pneumonia   . Acid reflux   . Migraine    History reviewed. No pertinent past surgical history. No family history on file. History  Substance Use Topics  . Smoking status: Former Smoker    Quit date: 03/14/2013  . Smokeless tobacco: Never Used  . Alcohol Use: Yes     Comment: occassionally   OB History   Grav Para Term Preterm Abortions TAB SAB Ect Mult Living                 Review of Systems  Musculoskeletal: Positive for back pain.  All other systems reviewed and are negative.    Allergies  Zithromax and Penicillins  Home Medications   Current Outpatient Rx  Name  Route  Sig  Dispense  Refill  . clindamycin (CLEOCIN) 150 MG capsule   Oral   Take 1 capsule (150 mg total) by mouth every 6 (six) hours.   28 capsule   0   . diphenhydrAMINE (BENADRYL) 25 mg capsule   Oral   Take 1 capsule (25 mg total) by mouth every 6 (six) hours as needed for sleep (migraine  headache - take with compazine).   30 capsule   0   . ibuprofen (ADVIL,MOTRIN) 800 MG tablet   Oral   Take 1 tablet (800 mg total) by mouth 3 (three) times daily.   21 tablet   0   . Norgestimate-Ethinyl Estradiol Triphasic (ORTHO TRI-CYCLEN, 28,) 0.18/0.215/0.25 MG-35 MCG tablet   Oral   Take 1 tablet by mouth daily.           Marland Kitchen oxyCODONE-acetaminophen (PERCOCET) 5-325 MG per tablet   Oral   Take 2 tablets by mouth every 4 (four) hours as needed for pain.   6 tablet   0   . prochlorperazine (COMPAZINE) 10 MG tablet   Oral   Take 1 tablet (10 mg total) by mouth 2 (two) times daily as needed. For migraine headache.   10 tablet   0    BP 155/69  Pulse 66  Temp(Src) 98.6 F (37 C) (Oral)  Resp 20  SpO2 100% Physical Exam  Nursing note and vitals reviewed. Constitutional: She is oriented to person, place, and time. She appears well-developed and well-nourished. No distress.  HENT:  Head: Normocephalic and atraumatic.  Neck: Normal range of motion. Neck supple.  Cardiovascular: Normal rate and regular rhythm.  Exam reveals no gallop and no friction rub.  No murmur heard. Pulmonary/Chest: Effort normal and breath sounds normal. No respiratory distress. She has no wheezes.  Abdominal: Soft. Bowel sounds are normal. She exhibits no distension. There is no tenderness.  Musculoskeletal: Normal range of motion.  There is ttp in the soft tissues of the left lumbar region.  There is no bony ttp or stepoffs.    Neurological: She is alert and oriented to person, place, and time.  The DTR's are 1+ and equal in the ble.  Strength is 5/5 in the ble and she is able to walk on heels and toes without difficulty.  Skin: Skin is warm and dry. She is not diaphoretic.    ED Course   Procedures (including critical care time)  Labs Reviewed  URINALYSIS, ROUTINE W REFLEX MICROSCOPIC  PREGNANCY, URINE   No results found. No diagnosis found.  MDM  UA suggestive of a uti, but xray  negative for fracture.  Will treat with cipro, tramadol.  Return prn.  Geoffery Lyons, MD 05/14/13 [phone]

## 2013-05-14 NOTE — ED Notes (Signed)
Patient states she fell three days ago after slipping.  Has had intermittent pain in her left lower back since falling.

## 2013-05-15 LAB — URINE CULTURE: Colony Count: 75000

## 2013-06-04 ENCOUNTER — Emergency Department (HOSPITAL_BASED_OUTPATIENT_CLINIC_OR_DEPARTMENT_OTHER)
Admission: EM | Admit: 2013-06-04 | Discharge: 2013-06-04 | Disposition: A | Discharge status: Home / Self Care | Payer: Self-pay | Attending: Emergency Medicine | Admitting: Emergency Medicine

## 2013-06-04 ENCOUNTER — Encounter (HOSPITAL_BASED_OUTPATIENT_CLINIC_OR_DEPARTMENT_OTHER): Payer: Self-pay | Admitting: *Deleted

## 2013-06-04 DIAGNOSIS — Z8701 Personal history of pneumonia (recurrent): Secondary | ICD-10-CM | POA: Insufficient documentation

## 2013-06-04 DIAGNOSIS — R110 Nausea: Secondary | ICD-10-CM | POA: Insufficient documentation

## 2013-06-04 DIAGNOSIS — K219 Gastro-esophageal reflux disease without esophagitis: Secondary | ICD-10-CM | POA: Insufficient documentation

## 2013-06-04 DIAGNOSIS — H538 Other visual disturbances: Secondary | ICD-10-CM | POA: Insufficient documentation

## 2013-06-04 DIAGNOSIS — H53149 Visual discomfort, unspecified: Secondary | ICD-10-CM | POA: Insufficient documentation

## 2013-06-04 DIAGNOSIS — Z880 Allergy status to penicillin: Secondary | ICD-10-CM | POA: Insufficient documentation

## 2013-06-04 DIAGNOSIS — Z87891 Personal history of nicotine dependence: Secondary | ICD-10-CM | POA: Insufficient documentation

## 2013-06-04 DIAGNOSIS — G43909 Migraine, unspecified, not intractable, without status migrainosus: Secondary | ICD-10-CM | POA: Insufficient documentation

## 2013-06-04 MED ORDER — METOCLOPRAMIDE HCL 5 MG/ML IJ SOLN
10.0000 mg | Freq: Once | INTRAMUSCULAR | Status: AC
  Administered 2013-06-04: 10 mg via INTRAVENOUS
  Filled 2013-06-04: qty 2

## 2013-06-04 MED ORDER — KETOROLAC TROMETHAMINE 30 MG/ML IJ SOLN
30.0000 mg | Freq: Once | INTRAMUSCULAR | Status: AC
  Administered 2013-06-04: 30 mg via INTRAVENOUS
  Filled 2013-06-04: qty 1

## 2013-06-04 MED ORDER — DIPHENHYDRAMINE HCL 50 MG/ML IJ SOLN
25.0000 mg | Freq: Once | INTRAMUSCULAR | Status: AC
  Administered 2013-06-04: 25 mg via INTRAVENOUS
  Filled 2013-06-04: qty 1

## 2013-06-04 MED ORDER — DEXAMETHASONE SODIUM PHOSPHATE 10 MG/ML IJ SOLN
10.0000 mg | Freq: Once | INTRAMUSCULAR | Status: AC
  Administered 2013-06-04: 10 mg via INTRAVENOUS
  Filled 2013-06-04: qty 1

## 2013-06-04 MED ORDER — SODIUM CHLORIDE 0.9 % IV BOLUS (SEPSIS)
1000.0000 mL | Freq: Once | INTRAVENOUS | Status: AC
  Administered 2013-06-04: 1000 mL via INTRAVENOUS

## 2013-06-04 NOTE — ED Provider Notes (Signed)
CSN: [id number]     Arrival date & time 06/04/13  0559 History     First MD Initiated Contact with Patient 06/04/13 [phone]     Chief Complaint  Patient presents with  . Migraine   (Consider location/radiation/quality/duration/timing/severity/associated sxs/prior Treatment) HPI This is a 22 year old female with a history of migraines. She is here with a migraine headache for the past 2 days. It is located on the left side of her head. It is throbbing and like previous migraines only more severe. It is associated with blurred vision, photophobia and nausea but no vomiting. There are no focal neurologic deficits. She has had no relief with Goody powders.  Past Medical History  Diagnosis Date  . Pneumonia   . Acid reflux   . Migraine    History reviewed. No pertinent past surgical history. History reviewed. No pertinent family history. History  Substance Use Topics  . Smoking status: Former Smoker    Quit date: 03/14/2013  . Smokeless tobacco: Never Used  . Alcohol Use: Yes     Comment: occassionally   OB History   Grav Para Term Preterm Abortions TAB SAB Ect Mult Living                 Review of Systems  All other systems reviewed and are negative.    Allergies  Zithromax and Penicillins  Home Medications   Current Outpatient Rx  Name  Route  Sig  Dispense  Refill  . ciprofloxacin (CIPRO) 500 MG tablet   Oral   Take 1 tablet (500 mg total) by mouth 2 (two) times daily.   10 tablet   0   . clindamycin (CLEOCIN) 150 MG capsule   Oral   Take 1 capsule (150 mg total) by mouth every 6 (six) hours.   28 capsule   0   . diphenhydrAMINE (BENADRYL) 25 mg capsule   Oral   Take 1 capsule (25 mg total) by mouth every 6 (six) hours as needed for sleep (migraine headache - take with compazine).   30 capsule   0   . ibuprofen (ADVIL,MOTRIN) 800 MG tablet   Oral   Take 1 tablet (800 mg total) by mouth 3 (three) times daily.   21 tablet   0   . Norgestimate-Ethinyl  Estradiol Triphasic (ORTHO TRI-CYCLEN, 28,) 0.18/0.215/0.25 MG-35 MCG tablet   Oral   Take 1 tablet by mouth daily.           Marland Kitchen oxyCODONE-acetaminophen (PERCOCET) 5-325 MG per tablet   Oral   Take 2 tablets by mouth every 4 (four) hours as needed for pain.   6 tablet   0   . prochlorperazine (COMPAZINE) 10 MG tablet   Oral   Take 1 tablet (10 mg total) by mouth 2 (two) times daily as needed. For migraine headache.   10 tablet   0   . traMADol (ULTRAM) 50 MG tablet   Oral   Take 1 tablet (50 mg total) by mouth every 6 (six) hours as needed for pain.   15 tablet   0    BP 157/94  Pulse 68  Temp(Src) 98.4 F (36.9 C) (Oral)  Resp 20  SpO2 100%  Physical Exam General: Well-developed, obese no acute distress; appearance consistent with age of record HENT: normocephalic; atraumatic Eyes: pupils equal, round and reactive to light; extraocular muscles intact; photophobia Neck: supple Heart: regular rate and rhythm Lungs: clear to auscultation bilaterally Abdomen: soft; nondistended; nontender; bowel sounds present Extremities:  No deformity; full range of motion; pulses normal Neurologic: Awake, alert and oriented; motor function intact in all extremities and symmetric; no facial droop; normal coordination and speech Skin: Warm and dry Psychiatric: Flat affect    ED Course   Procedures (including critical care time)   MDM  7:06 AM Feels significantly better after IV medications. States she is ready to go home.  Hanley Seamen, MD 06/04/13 [phone]

## 2013-06-04 NOTE — ED Notes (Signed)
MD at bedside. 

## 2013-06-04 NOTE — ED Notes (Signed)
C/o migraine headache since Saturday, no relief from Goody's powders.

## 2013-06-29 ENCOUNTER — Encounter (HOSPITAL_BASED_OUTPATIENT_CLINIC_OR_DEPARTMENT_OTHER): Payer: Self-pay | Admitting: *Deleted

## 2013-06-29 DIAGNOSIS — Z87891 Personal history of nicotine dependence: Secondary | ICD-10-CM | POA: Insufficient documentation

## 2013-06-29 DIAGNOSIS — Z8719 Personal history of other diseases of the digestive system: Secondary | ICD-10-CM | POA: Insufficient documentation

## 2013-06-29 DIAGNOSIS — Z880 Allergy status to penicillin: Secondary | ICD-10-CM | POA: Insufficient documentation

## 2013-06-29 DIAGNOSIS — G43909 Migraine, unspecified, not intractable, without status migrainosus: Secondary | ICD-10-CM | POA: Insufficient documentation

## 2013-06-29 DIAGNOSIS — Z8701 Personal history of pneumonia (recurrent): Secondary | ICD-10-CM | POA: Insufficient documentation

## 2013-06-29 NOTE — ED Notes (Signed)
Pt c/o " migraine" x 2 days n/v

## 2013-06-30 ENCOUNTER — Emergency Department (HOSPITAL_BASED_OUTPATIENT_CLINIC_OR_DEPARTMENT_OTHER)
Admission: EM | Admit: 2013-06-30 | Discharge: 2013-06-30 | Disposition: A | Discharge status: Home / Self Care | Payer: Medicaid Other | Attending: Emergency Medicine | Admitting: Emergency Medicine

## 2013-06-30 DIAGNOSIS — G43909 Migraine, unspecified, not intractable, without status migrainosus: Secondary | ICD-10-CM

## 2013-06-30 MED ORDER — ONDANSETRON 8 MG PO TBDP
ORAL_TABLET | ORAL | Status: AC
  Administered 2013-06-30: 8 mg
  Filled 2013-06-30: qty 1

## 2013-06-30 MED ORDER — PROMETHAZINE HCL 25 MG PO TABS
12.5000 mg | ORAL_TABLET | Freq: Once | ORAL | Status: DC
  Filled 2013-06-30: qty 1

## 2013-06-30 MED ORDER — METOCLOPRAMIDE HCL 10 MG PO TABS
10.0000 mg | ORAL_TABLET | Freq: Once | ORAL | Status: AC
  Administered 2013-06-30: 10 mg via ORAL
  Filled 2013-06-30: qty 1

## 2013-06-30 MED ORDER — PREDNISONE 50 MG PO TABS
60.0000 mg | ORAL_TABLET | Freq: Once | ORAL | Status: AC
  Administered 2013-06-30: 60 mg via ORAL
  Filled 2013-06-30: qty 1

## 2013-06-30 MED ORDER — DIPHENHYDRAMINE HCL 25 MG PO CAPS
25.0000 mg | ORAL_CAPSULE | Freq: Once | ORAL | Status: AC
  Administered 2013-06-30: 25 mg via ORAL
  Filled 2013-06-30: qty 1

## 2013-06-30 NOTE — ED Provider Notes (Signed)
CSN: [id number]     Arrival date & time 06/29/13  2348 History   First MD Initiated Contact with Patient 06/30/13 0024     Chief Complaint  Patient presents with  . Migraine   (Consider location/radiation/quality/duration/timing/severity/associated sxs/prior Treatment) Patient is a 22 y.o. female presenting with migraines. The history is provided by the patient. No language interpreter was used.  Migraine This is a recurrent problem. The current episode started 2 days ago. The problem occurs constantly. The problem has not changed since onset.Pertinent negatives include no chest pain, no abdominal pain and no shortness of breath. Nothing aggravates the symptoms. Nothing relieves the symptoms. She has tried nothing for the symptoms. The treatment provided no relief.    Past Medical History  Diagnosis Date  . Pneumonia   . Acid reflux   . Migraine    History reviewed. No pertinent past surgical history. History reviewed. No pertinent family history. History  Substance Use Topics  . Smoking status: Former Smoker    Quit date: 03/14/2013  . Smokeless tobacco: Never Used  . Alcohol Use: Yes     Comment: occassionally   OB History   Grav Para Term Preterm Abortions TAB SAB Ect Mult Living                 Review of Systems  Constitutional: Negative for fever.  HENT: Negative for neck pain and neck stiffness.   Eyes: Negative for photophobia.  Respiratory: Negative for shortness of breath.   Cardiovascular: Negative for chest pain.  Gastrointestinal: Negative for abdominal pain.  Neurological: Negative for dizziness, facial asymmetry, speech difficulty, weakness, light-headedness and numbness.  All other systems reviewed and are negative.    Allergies  Zithromax and Penicillins  Home Medications  No current outpatient prescriptions on file. BP 143/87  Pulse 63  Temp(Src) 98.7 F (37.1 C) (Oral)  Resp 16  Ht 5\' 4"  (1.626 m)  Wt 283 lb 2 oz (128.425 kg)  BMI 48.57  kg/m2  SpO2 100% Physical Exam  Constitutional: She is oriented to person, place, and time. She appears well-developed and well-nourished. No distress.  HENT:  Head: Normocephalic and atraumatic.  Mouth/Throat: Oropharynx is clear and moist.  Eyes: Conjunctivae are normal. Pupils are equal, round, and reactive to light.  Neck: Normal range of motion. Neck supple.  Cardiovascular: Normal rate, regular rhythm and intact distal pulses.   Pulmonary/Chest: Effort normal and breath sounds normal. She has no wheezes. She has no rales.  Abdominal: Soft. Bowel sounds are normal. There is no tenderness. There is no rebound and no guarding.  Musculoskeletal: Normal range of motion. She exhibits no edema.  Lymphadenopathy:    She has no cervical adenopathy.  Neurological: She is alert and oriented to person, place, and time. She has normal reflexes. No cranial nerve deficit.  Skin: Skin is warm and dry.  Psychiatric: She has a normal mood and affect.    ED Course  Procedures (including critical care time) Labs Review Labs Reviewed - No data to display Imaging Review No results found.  MDM  No diagnosis found. Pain free exam and vitals reassuring no indication for imaging    Jamielynn Wigley Smitty Cords, MD 06/30/13 [phone]

## 2013-06-30 NOTE — ED Notes (Signed)
Pt sleeping after receiving meds

## 2013-06-30 NOTE — ED Notes (Signed)
Denies blurred vision and ambulates with steady gate

## 2013-10-26 ENCOUNTER — Emergency Department (HOSPITAL_BASED_OUTPATIENT_CLINIC_OR_DEPARTMENT_OTHER)
Admission: EM | Admit: 2013-10-26 | Discharge: 2013-10-26 | Disposition: A | Discharge status: Home / Self Care | Payer: Medicaid Other | Attending: Emergency Medicine | Admitting: Emergency Medicine

## 2013-10-26 ENCOUNTER — Encounter (HOSPITAL_BASED_OUTPATIENT_CLINIC_OR_DEPARTMENT_OTHER): Payer: Self-pay | Admitting: Emergency Medicine

## 2013-10-26 DIAGNOSIS — Z3202 Encounter for pregnancy test, result negative: Secondary | ICD-10-CM | POA: Insufficient documentation

## 2013-10-26 DIAGNOSIS — Z8701 Personal history of pneumonia (recurrent): Secondary | ICD-10-CM | POA: Insufficient documentation

## 2013-10-26 DIAGNOSIS — Z8719 Personal history of other diseases of the digestive system: Secondary | ICD-10-CM | POA: Insufficient documentation

## 2013-10-26 DIAGNOSIS — Z87891 Personal history of nicotine dependence: Secondary | ICD-10-CM | POA: Insufficient documentation

## 2013-10-26 DIAGNOSIS — G43909 Migraine, unspecified, not intractable, without status migrainosus: Secondary | ICD-10-CM | POA: Insufficient documentation

## 2013-10-26 DIAGNOSIS — Z880 Allergy status to penicillin: Secondary | ICD-10-CM | POA: Insufficient documentation

## 2013-10-26 LAB — PREGNANCY, URINE: Preg Test, Ur: NEGATIVE

## 2013-10-26 MED ORDER — METOCLOPRAMIDE HCL 5 MG/ML IJ SOLN
5.0000 mg | Freq: Once | INTRAMUSCULAR | Status: AC
  Administered 2013-10-26: 5 mg via INTRAVENOUS
  Filled 2013-10-26: qty 2

## 2013-10-26 MED ORDER — DIPHENHYDRAMINE HCL 50 MG/ML IJ SOLN
25.0000 mg | Freq: Once | INTRAMUSCULAR | Status: AC
Start: 2013-10-26 — End: 2013-10-26
  Administered 2013-10-26: 25 mg via INTRAVENOUS
  Filled 2013-10-26: qty 1

## 2013-10-26 MED ORDER — SODIUM CHLORIDE 0.9 % IV BOLUS (SEPSIS)
1000.0000 mL | Freq: Once | INTRAVENOUS | Status: AC
  Administered 2013-10-26: 1000 mL via INTRAVENOUS

## 2013-10-26 MED ORDER — DEXAMETHASONE SODIUM PHOSPHATE 10 MG/ML IJ SOLN
10.0000 mg | Freq: Once | INTRAMUSCULAR | Status: AC
  Administered 2013-10-26: 10 mg via INTRAVENOUS
  Filled 2013-10-26: qty 1

## 2013-10-26 NOTE — Discharge Instructions (Signed)

## 2013-10-26 NOTE — ED Notes (Signed)
Pt sts she is unable to urinate at this time. 

## 2013-10-26 NOTE — ED Notes (Signed)
Patient states she has had a headache for the last three days.  States she has a history of migraines, but this is not like her normal migraine.  States the headache is associated with nausea, dizziness and spinning sensation.

## 2013-10-26 NOTE — ED Provider Notes (Signed)
CSN: [id number]     Arrival date & time 10/26/13  1032 History   First MD Initiated Contact with Patient 10/26/13 1112     Chief Complaint  Patient presents with  . Headache   (Consider location/radiation/quality/duration/timing/severity/associated sxs/prior Treatment) Patient is a 23 y.o. female presenting with headaches. The history is provided by the patient.  Headache Pain location:  Occipital and L temporal Quality:  Dull Radiates to:  Does not radiate Severity currently:  8/10 Severity at highest:  10/10 Onset quality:  Sudden Duration:  3 days Timing:  Constant Progression:  Unchanged Chronicity:  Recurrent Similar to prior headaches: yes   Context: bright light   Relieved by:  Nothing Worsened by:  Nothing tried Ineffective treatments:  NSAIDs Associated symptoms: photophobia   Associated symptoms: no abdominal pain, no back pain, no congestion, no cough, no diarrhea, no dizziness, no pain, no fatigue, no fever, no nausea, no neck pain and no vomiting     Past Medical History  Diagnosis Date  . Pneumonia   . Acid reflux   . Migraine    History reviewed. No pertinent past surgical history. No family history on file. History  Substance Use Topics  . Smoking status: Former Smoker    Quit date: 03/14/2013  . Smokeless tobacco: Never Used  . Alcohol Use: Yes     Comment: occassionally   OB History   Grav Para Term Preterm Abortions TAB SAB Ect Mult Living                 Review of Systems  Constitutional: Negative for fever and fatigue.  HENT: Negative for congestion and drooling.   Eyes: Positive for photophobia. Negative for pain.  Respiratory: Negative for cough and shortness of breath.   Cardiovascular: Negative for chest pain.  Gastrointestinal: Negative for nausea, vomiting, abdominal pain and diarrhea.  Genitourinary: Negative for dysuria and hematuria.  Musculoskeletal: Negative for back pain, gait problem and neck pain.  Skin: Negative for color  change.  Neurological: Positive for headaches. Negative for dizziness.  Hematological: Negative for adenopathy.  Psychiatric/Behavioral: Negative for behavioral problems.  All other systems reviewed and are negative.    Allergies  Zithromax and Penicillins  Home Medications  No current outpatient prescriptions on file. BP 149/90  Pulse 72  Temp(Src) 99.1 F (37.3 C) (Oral)  Resp 18  Ht 5\' 6"  (1.676 m)  Wt 255 lb (115.667 kg)  BMI 41.18 kg/m2  SpO2 100% Physical Exam  Nursing note and vitals reviewed. Constitutional: She is oriented to person, place, and time. She appears well-developed and well-nourished.  HENT:  Head: Normocephalic.  Mouth/Throat: Oropharynx is clear and moist. No oropharyngeal exudate.  Eyes: Conjunctivae and EOM are normal. Pupils are equal, round, and reactive to light.  Neck: Normal range of motion. Neck supple.  Cardiovascular: Normal rate, regular rhythm, normal heart sounds and intact distal pulses.  Exam reveals no gallop and no friction rub.   No murmur heard. Pulmonary/Chest: Effort normal and breath sounds normal. No respiratory distress. She has no wheezes.  Abdominal: Soft. Bowel sounds are normal. There is no tenderness. There is no rebound and no guarding.  Musculoskeletal: Normal range of motion. She exhibits no edema and no tenderness.  Neurological: She is alert and oriented to person, place, and time. She has normal strength. No cranial nerve deficit or sensory deficit. Coordination and gait normal.  Skin: Skin is warm and dry.  Psychiatric: She has a normal mood and affect. Her behavior is  normal.    ED Course  Procedures (including critical care time) Labs Review Labs Reviewed  PREGNANCY, URINE   Imaging Review No results found.  EKG Interpretation   None       MDM   1. Migraine    11:35 AM 23 y.o. female who presents with a migraine headache which started 3 days ago while she was at work. She notes that the headache  did start quickly but is consistent with her migraines. She denies hitting her head or fevers or vomiting. She is afebrile and vital signs are unremarkable here. She has mild photophobia on exam. She has had relief with a migraine cocktail in the past. Will give migraine cocktail.  1:33 PM: Pt notes HA now gone.  I have discussed the diagnosis/risks/treatment options with the patient and believe the pt to be eligible for discharge home to follow-up with pcp as needed. We also discussed returning to the ED immediately if new or worsening sx occur. We discussed the sx which are most concerning (e.g., return of HA, fever) that necessitate immediate return. Any new prescriptions provided to the patient are listed below.  New Prescriptions   No medications on file     Junius ArgyleForrest S Jacinta Penalver, MD 10/26/13 [phone]

## 2013-12-07 ENCOUNTER — Emergency Department (HOSPITAL_BASED_OUTPATIENT_CLINIC_OR_DEPARTMENT_OTHER)
Admission: EM | Admit: 2013-12-07 | Discharge: 2013-12-07 | Disposition: A | Discharge status: Home / Self Care | Payer: Medicaid Other | Attending: Emergency Medicine | Admitting: Emergency Medicine

## 2013-12-07 ENCOUNTER — Encounter (HOSPITAL_BASED_OUTPATIENT_CLINIC_OR_DEPARTMENT_OTHER): Payer: Self-pay | Admitting: Emergency Medicine

## 2013-12-07 DIAGNOSIS — Z87891 Personal history of nicotine dependence: Secondary | ICD-10-CM | POA: Insufficient documentation

## 2013-12-07 DIAGNOSIS — Z880 Allergy status to penicillin: Secondary | ICD-10-CM | POA: Insufficient documentation

## 2013-12-07 DIAGNOSIS — Z8679 Personal history of other diseases of the circulatory system: Secondary | ICD-10-CM | POA: Insufficient documentation

## 2013-12-07 DIAGNOSIS — Z3202 Encounter for pregnancy test, result negative: Secondary | ICD-10-CM | POA: Insufficient documentation

## 2013-12-07 DIAGNOSIS — Z8701 Personal history of pneumonia (recurrent): Secondary | ICD-10-CM | POA: Insufficient documentation

## 2013-12-07 DIAGNOSIS — Z8719 Personal history of other diseases of the digestive system: Secondary | ICD-10-CM | POA: Insufficient documentation

## 2013-12-07 DIAGNOSIS — J329 Chronic sinusitis, unspecified: Secondary | ICD-10-CM | POA: Insufficient documentation

## 2013-12-07 LAB — PREGNANCY, URINE: PREG TEST UR: NEGATIVE

## 2013-12-07 LAB — RAPID STREP SCREEN (MED CTR MEBANE ONLY): STREPTOCOCCUS, GROUP A SCREEN (DIRECT): NEGATIVE

## 2013-12-07 MED ORDER — OXYMETAZOLINE HCL 0.05 % NA SOLN: 1.0000 | Freq: Two times a day (BID) | NASAL | Status: DC

## 2013-12-07 MED ORDER — LEVOFLOXACIN 500 MG PO TABS: 500.0000 mg | ORAL_TABLET | Freq: Every day | ORAL | Status: DC

## 2013-12-07 NOTE — ED Provider Notes (Signed)
CSN: [id number]     Arrival date & time 12/07/13  1542 History   First MD Initiated Contact with Patient 12/07/13 1557     Chief Complaint  Patient presents with  . Sore Throat     (Consider location/radiation/quality/duration/timing/severity/associated sxs/prior Treatment) HPI Comments: Patient is otherwise healthy 23 year old female who presents to the ED with 4 day history of fever, chills, sorethroat, nasal congestion and sinus pain - she denies cough or chest congestion.  She is able to swallow without difficulty, denies swollen glands.  Patient is a 23 y.o. female presenting with pharyngitis. The history is provided by the patient. No language interpreter was used.  Sore Throat This is a new problem. The current episode started in the past 7 days. The problem occurs constantly. The problem has been unchanged. Associated symptoms include congestion, a fever, headaches and a sore throat. Pertinent negatives include no abdominal pain, anorexia, chest pain, chills, coughing, myalgias, nausea, neck pain, swollen glands, urinary symptoms, visual change, vomiting or weakness. Nothing aggravates the symptoms. She has tried nothing for the symptoms. The treatment provided no relief.    Past Medical History  Diagnosis Date  . Pneumonia   . Acid reflux   . Migraine    History reviewed. No pertinent past surgical history. No family history on file. History  Substance Use Topics  . Smoking status: Former Smoker    Quit date: 03/14/2013  . Smokeless tobacco: Never Used  . Alcohol Use: Yes     Comment: occassionally   OB History   Grav Para Term Preterm Abortions TAB SAB Ect Mult Living                 Review of Systems  Constitutional: Positive for fever. Negative for chills.  HENT: Positive for congestion and sore throat.   Respiratory: Negative for cough.   Cardiovascular: Negative for chest pain.  Gastrointestinal: Negative for nausea, vomiting, abdominal pain and anorexia.   Musculoskeletal: Negative for myalgias and neck pain.  Neurological: Positive for headaches. Negative for weakness.  All other systems reviewed and are negative.      Allergies  Zithromax and Penicillins  Home Medications  No current outpatient prescriptions on file. BP 142/95  Pulse 88  Temp(Src) 99.2 F (37.3 C) (Oral)  Resp 18  Ht 5\' 5"  (1.651 m)  Wt 265 lb (120.203 kg)  BMI 44.10 kg/m2  SpO2 100%  LMP 12/07/2013 Physical Exam  Nursing note and vitals reviewed. Constitutional: She is oriented to person, place, and time. She appears well-developed and well-nourished. No distress.  HENT:  Head: Normocephalic and atraumatic.  Right Ear: External ear normal.  Left Ear: External ear normal.  Mouth/Throat: No oropharyngeal exudate.  Posterior pharynx with erythema, no exudate, bilateral maxillary sinus tenderness to percussion.  Eyes: Conjunctivae are normal. Pupils are equal, round, and reactive to light. No scleral icterus.  Neck: Normal range of motion. Neck supple.  No nuchal ridigity  Cardiovascular: Normal rate, regular rhythm and normal heart sounds.  Exam reveals no gallop and no friction rub.   No murmur heard. Pulmonary/Chest: Effort normal and breath sounds normal. No respiratory distress. She has no wheezes. She has no rales. She exhibits no tenderness.  Abdominal: Soft. Bowel sounds are normal. She exhibits no distension. There is no tenderness.  Musculoskeletal: Normal range of motion. She exhibits no edema and no tenderness.  Lymphadenopathy:    She has no cervical adenopathy.  Neurological: She is alert and oriented to person, place,  and time. She exhibits normal muscle tone. Coordination normal.  Skin: Skin is warm and dry. No rash noted. No erythema. No pallor.  Psychiatric: She has a normal mood and affect. Her behavior is normal. Judgment and thought content normal.    ED Course  Procedures (including critical care time) Labs Review Labs Reviewed   RAPID STREP SCREEN  CULTURE, GROUP A STREP  PREGNANCY, URINE   Imaging Review No results found.  EKG Interpretation  None Results for orders placed during the hospital encounter of 12/07/13  RAPID STREP SCREEN      Result Value Ref Range   Streptococcus, Group A Screen (Direct) NEGATIVE  NEGATIVE  PREGNANCY, URINE      Result Value Ref Range   Preg Test, Ur NEGATIVE  NEGATIVE   No results found.   MDM   Sinusitis  Patient here with bilateral maxillary sinus pain, sorethroat, fever, headache.  Strep negative, will start on antibiotics for the sinuses and nasal decongestant.  No trismus, no evidence of ludwig's angina, PTA.   Jamie Archer, [place] 12/07/13 1746

## 2013-12-07 NOTE — ED Notes (Signed)
Pt. Reports sore throat and pain with swallowing.  Pt. Reports fever for about 3 days.

## 2013-12-07 NOTE — Discharge Instructions (Signed)
Sinusitis  Sinusitis is redness, soreness, and swelling (inflammation) of the paranasal sinuses. Paranasal sinuses are air pockets within the bones of your face (beneath the eyes, the middle of the forehead, or above the eyes). In healthy paranasal sinuses, mucus is able to drain out, and air is able to circulate through them by way of your nose. However, when your paranasal sinuses are inflamed, mucus and air can become trapped. This can allow bacteria and other germs to grow and cause infection.  Sinusitis can develop quickly and last only a short time (acute) or continue over a long period (chronic). Sinusitis that lasts for more than 12 weeks is considered chronic.   CAUSES   Causes of sinusitis include:  · Allergies.  · Structural abnormalities, such as displacement of the cartilage that separates your nostrils (deviated septum), which can decrease the air flow through your nose and sinuses and affect sinus drainage.  · Functional abnormalities, such as when the small hairs (cilia) that line your sinuses and help remove mucus do not work properly or are not present.  SYMPTOMS   Symptoms of acute and chronic sinusitis are the same. The primary symptoms are pain and pressure around the affected sinuses. Other symptoms include:  · Upper toothache.  · Earache.  · Headache.  · Bad breath.  · Decreased sense of smell and taste.  · A cough, which worsens when you are lying flat.  · Fatigue.  · Fever.  · Thick drainage from your nose, which often is green and may contain pus (purulent).  · Swelling and warmth over the affected sinuses.  DIAGNOSIS   Your caregiver will perform a physical exam. During the exam, your caregiver may:  · Look in your nose for signs of abnormal growths in your nostrils (nasal polyps).  · Tap over the affected sinus to check for signs of infection.  · View the inside of your sinuses (endoscopy) with a special imaging device with a light attached (endoscope), which is inserted into your  sinuses.  If your caregiver suspects that you have chronic sinusitis, one or more of the following tests may be recommended:  · Allergy tests.  · Nasal culture A sample of mucus is taken from your nose and sent to a lab and screened for bacteria.  · Nasal cytology A sample of mucus is taken from your nose and examined by your caregiver to determine if your sinusitis is related to an allergy.  TREATMENT   Most cases of acute sinusitis are related to a viral infection and will resolve on their own within 10 days. Sometimes medicines are prescribed to help relieve symptoms (pain medicine, decongestants, nasal steroid sprays, or saline sprays).   However, for sinusitis related to a bacterial infection, your caregiver will prescribe antibiotic medicines. These are medicines that will help kill the bacteria causing the infection.   Rarely, sinusitis is caused by a fungal infection. In theses cases, your caregiver will prescribe antifungal medicine.  For some cases of chronic sinusitis, surgery is needed. Generally, these are cases in which sinusitis recurs more than 3 times per year, despite other treatments.  HOME CARE INSTRUCTIONS   · Drink plenty of water. Water helps thin the mucus so your sinuses can drain more easily.  · Use a humidifier.  · Inhale steam 3 to 4 times a day (for example, sit in the bathroom with the shower running).  · Apply a warm, moist washcloth to your face 3 to 4 times a day,   or as directed by your caregiver.  · Use saline nasal sprays to help moisten and clean your sinuses.  · Take over-the-counter or prescription medicines for pain, discomfort, or fever only as directed by your caregiver.  SEEK IMMEDIATE MEDICAL CARE IF:  · You have increasing pain or severe headaches.  · You have nausea, vomiting, or drowsiness.  · You have swelling around your face.  · You have vision problems.  · You have a stiff neck.  · You have difficulty breathing.  MAKE SURE YOU:   · Understand these  instructions.  · Will watch your condition.  · Will get help right away if you are not doing well or get worse.  Document Released: 09/27/2005 Document Revised: 12/20/2011 Document Reviewed: 10/12/2011  ExitCare® Patient Information ©2014 [employer].  Upper Respiratory Infection, Adult  An upper respiratory infection (URI) is also sometimes known as the common cold. The upper respiratory tract includes the nose, sinuses, throat, trachea, and bronchi. Bronchi are the airways leading to the lungs. Most people improve within 1 week, but symptoms can last up to 2 weeks. A residual cough may last even longer.   CAUSES  Many different viruses can infect the tissues lining the upper respiratory tract. The tissues become irritated and inflamed and often become very moist. Mucus production is also common. A cold is contagious. You can easily spread the virus to others by oral contact. This includes kissing, sharing a glass, coughing, or sneezing. Touching your mouth or nose and then touching a surface, which is then touched by another person, can also spread the virus.  SYMPTOMS   Symptoms typically develop 1 to 3 days after you come in contact with a cold virus. Symptoms vary from person to person. They may include:  · Runny nose.  · Sneezing.  · Nasal congestion.  · Sinus irritation.  · Sore throat.  · Loss of voice (laryngitis).  · Cough.  · Fatigue.  · Muscle aches.  · Loss of appetite.  · Headache.  · Low-grade fever.  DIAGNOSIS   You might diagnose your own cold based on familiar symptoms, since most people get a cold 2 to 3 times a year. Your caregiver can confirm this based on your exam. Most importantly, your caregiver can check that your symptoms are not due to another disease such as strep throat, sinusitis, pneumonia, asthma, or epiglottitis. Blood tests, throat tests, and X-rays are not necessary to diagnose a common cold, but they may sometimes be helpful in excluding other more serious diseases. Your  caregiver will decide if any further tests are required.  RISKS AND COMPLICATIONS   You may be at risk for a more severe case of the common cold if you smoke cigarettes, have chronic heart disease (such as heart failure) or lung disease (such as asthma), or if you have a weakened immune system. The very young and very old are also at risk for more serious infections. Bacterial sinusitis, middle ear infections, and bacterial pneumonia can complicate the common cold. The common cold can worsen asthma and chronic obstructive pulmonary disease (COPD). Sometimes, these complications can require emergency medical care and may be life-threatening.  PREVENTION   The best way to protect against getting a cold is to practice good hygiene. Avoid oral or hand contact with people with cold symptoms. Wash your hands often if contact occurs. There is no clear evidence that vitamin C, vitamin E, echinacea, or exercise reduces the chance of developing a cold. However,   it is always recommended to get plenty of rest and practice good nutrition.  TREATMENT   Treatment is directed at relieving symptoms. There is no cure. Antibiotics are not effective, because the infection is caused by a virus, not by bacteria. Treatment may include:  · Increased fluid intake. Sports drinks offer valuable electrolytes, sugars, and fluids.  · Breathing heated mist or steam (vaporizer or shower).  · Eating chicken soup or other clear broths, and maintaining good nutrition.  · Getting plenty of rest.  · Using gargles or lozenges for comfort.  · Controlling fevers with ibuprofen or acetaminophen as directed by your caregiver.  · Increasing usage of your inhaler if you have asthma.  Zinc gel and zinc lozenges, taken in the first 24 hours of the common cold, can shorten the duration and lessen the severity of symptoms. Pain medicines may help with fever, muscle aches, and throat pain. A variety of non-prescription medicines are available to treat congestion  and runny nose. Your caregiver can make recommendations and may suggest nasal or lung inhalers for other symptoms.   HOME CARE INSTRUCTIONS   · Only take over-the-counter or prescription medicines for pain, discomfort, or fever as directed by your caregiver.  · Use a warm mist humidifier or inhale steam from a shower to increase air moisture. This may keep secretions moist and make it easier to breathe.  · Drink enough water and fluids to keep your urine clear or pale yellow.  · Rest as needed.  · Return to work when your temperature has returned to normal or as your caregiver advises. You may need to stay home longer to avoid infecting others. You can also use a face mask and careful hand washing to prevent spread of the virus.  SEEK MEDICAL CARE IF:   · After the first few days, you feel you are getting worse rather than better.  · You need your caregiver's advice about medicines to control symptoms.  · You develop chills, worsening shortness of breath, or brown or red sputum. These may be signs of pneumonia.  · You develop yellow or brown nasal discharge or pain in the face, especially when you bend forward. These may be signs of sinusitis.  · You develop a fever, swollen neck glands, pain with swallowing, or white areas in the back of your throat. These may be signs of strep throat.  SEEK IMMEDIATE MEDICAL CARE IF:   · You have a fever.  · You develop severe or persistent headache, ear pain, sinus pain, or chest pain.  · You develop wheezing, a prolonged cough, cough up blood, or have a change in your usual mucus (if you have chronic lung disease).  · You develop sore muscles or a stiff neck.  Document Released: 03/23/2001 Document Revised: 12/20/2011 Document Reviewed: 01/29/2011  ExitCare® Patient Information ©2014 [employer].

## 2013-12-08 NOTE — ED Provider Notes (Signed)
Medical screening examination/treatment/procedure(s) were performed by non-physician practitioner and as supervising physician I was immediately available for consultation/collaboration.   EKG Interpretation None       Jamie RudeNathan R. Rubin PayorPickering, MD 12/08/13 2235

## 2013-12-09 LAB — CULTURE, GROUP A STREP

## 2013-12-20 ENCOUNTER — Encounter (HOSPITAL_BASED_OUTPATIENT_CLINIC_OR_DEPARTMENT_OTHER): Payer: Self-pay | Admitting: Emergency Medicine

## 2013-12-20 ENCOUNTER — Emergency Department (HOSPITAL_BASED_OUTPATIENT_CLINIC_OR_DEPARTMENT_OTHER)
Admission: EM | Admit: 2013-12-20 | Discharge: 2013-12-20 | Disposition: A | Discharge status: Home / Self Care | Payer: Medicaid Other | Attending: Emergency Medicine | Admitting: Emergency Medicine

## 2013-12-20 DIAGNOSIS — Z8701 Personal history of pneumonia (recurrent): Secondary | ICD-10-CM | POA: Insufficient documentation

## 2013-12-20 DIAGNOSIS — Z3202 Encounter for pregnancy test, result negative: Secondary | ICD-10-CM | POA: Insufficient documentation

## 2013-12-20 DIAGNOSIS — K5289 Other specified noninfective gastroenteritis and colitis: Secondary | ICD-10-CM | POA: Insufficient documentation

## 2013-12-20 DIAGNOSIS — K529 Noninfective gastroenteritis and colitis, unspecified: Secondary | ICD-10-CM

## 2013-12-20 DIAGNOSIS — Z8679 Personal history of other diseases of the circulatory system: Secondary | ICD-10-CM | POA: Insufficient documentation

## 2013-12-20 DIAGNOSIS — Z87891 Personal history of nicotine dependence: Secondary | ICD-10-CM | POA: Insufficient documentation

## 2013-12-20 DIAGNOSIS — Z792 Long term (current) use of antibiotics: Secondary | ICD-10-CM | POA: Insufficient documentation

## 2013-12-20 DIAGNOSIS — Z79899 Other long term (current) drug therapy: Secondary | ICD-10-CM | POA: Insufficient documentation

## 2013-12-20 DIAGNOSIS — Z880 Allergy status to penicillin: Secondary | ICD-10-CM | POA: Insufficient documentation

## 2013-12-20 LAB — URINALYSIS, ROUTINE W REFLEX MICROSCOPIC
Bilirubin Urine: NEGATIVE
GLUCOSE, UA: NEGATIVE mg/dL
HGB URINE DIPSTICK: NEGATIVE
Ketones, ur: NEGATIVE mg/dL
LEUKOCYTES UA: NEGATIVE
Nitrite: NEGATIVE
PROTEIN: NEGATIVE mg/dL
SPECIFIC GRAVITY, URINE: 1.01 (ref 1.005–1.030)
UROBILINOGEN UA: 0.2 mg/dL (ref 0.0–1.0)
pH: 7 (ref 5.0–8.0)

## 2013-12-20 LAB — PREGNANCY, URINE: Preg Test, Ur: NEGATIVE

## 2013-12-20 MED ORDER — SODIUM CHLORIDE 0.9 % IV BOLUS (SEPSIS)
1000.0000 mL | Freq: Once | INTRAVENOUS | Status: AC
  Administered 2013-12-20: 1000 mL via INTRAVENOUS

## 2013-12-20 MED ORDER — ONDANSETRON HCL 4 MG/2ML IJ SOLN
4.0000 mg | Freq: Once | INTRAMUSCULAR | Status: AC
  Administered 2013-12-20: 4 mg via INTRAVENOUS
  Filled 2013-12-20: qty 2

## 2013-12-20 MED ORDER — ONDANSETRON HCL 4 MG PO TABS: 4.0000 mg | ORAL_TABLET | Freq: Four times a day (QID) | ORAL | Status: DC

## 2013-12-20 NOTE — ED Notes (Signed)
Attempted IV access in [facility] unsuccessful.

## 2013-12-20 NOTE — ED Provider Notes (Signed)
CSN: [id number]     Arrival date & time 12/20/13  1005 History   First MD Initiated Contact with Patient 12/20/13 1158     Chief Complaint  Patient presents with  . Diarrhea  . Emesis     (Consider location/radiation/quality/duration/timing/severity/associated sxs/prior Treatment) Patient is a 23 y.o. female presenting with GI illness and vomiting. The history is provided by the patient. No language interpreter was used.  GI Problem This is a new problem. The current episode started 6 to 12 hours ago. The problem occurs constantly. The problem has not changed since onset.Pertinent negatives include no chest pain, no abdominal pain, no headaches and no shortness of breath. The symptoms are aggravated by eating and drinking. Nothing relieves the symptoms. She has tried nothing for the symptoms. The treatment provided no relief.  Emesis Severity:  Severe Timing:  Constant Quality:  Stomach contents Progression:  Unchanged Chronicity:  New Recent urination:  Normal Relieved by:  Nothing Worsened by:  Nothing tried Ineffective treatments:  None tried Associated symptoms: diarrhea   Associated symptoms: no abdominal pain, no arthralgias, no chills, no cough, no fever, no headaches, no myalgias, no sore throat and no URI   Diarrhea:    Quality:  Watery   Severity:  Moderate   Duration:  8 hours   Timing:  Intermittent   Progression:  Unchanged Risk factors: no alcohol use, no diabetes, not pregnant now, no prior abdominal surgery, no sick contacts, no suspect food intake and no travel to endemic areas     Past Medical History  Diagnosis Date  . Pneumonia   . Acid reflux   . Migraine    History reviewed. No pertinent past surgical history. No family history on file. History  Substance Use Topics  . Smoking status: Former Smoker    Quit date: 03/14/2013  . Smokeless tobacco: Never Used  . Alcohol Use: Yes     Comment: occassionally   OB History   Grav Para Term Preterm  Abortions TAB SAB Ect Mult Living                 Review of Systems  Constitutional: Negative for fever, chills, diaphoresis, activity change, appetite change and fatigue.  HENT: Negative for congestion, facial swelling, rhinorrhea and sore throat.   Eyes: Negative for photophobia and discharge.  Respiratory: Negative for cough, chest tightness and shortness of breath.   Cardiovascular: Negative for chest pain, palpitations and leg swelling.  Gastrointestinal: Positive for nausea, vomiting and diarrhea. Negative for abdominal pain.  Endocrine: Negative for polydipsia and polyuria.  Genitourinary: Negative for dysuria, frequency, difficulty urinating and pelvic pain.  Musculoskeletal: Negative for arthralgias, back pain, myalgias, neck pain and neck stiffness.  Skin: Negative for color change and wound.  Allergic/Immunologic: Negative for immunocompromised state.  Neurological: Negative for facial asymmetry, weakness, numbness and headaches.  Hematological: Does not bruise/bleed easily.  Psychiatric/Behavioral: Negative for confusion and agitation.      Allergies  Zithromax and Penicillins  Home Medications   Current Outpatient Rx  Name  Route  Sig  Dispense  Refill  . levofloxacin (LEVAQUIN) 500 MG tablet   Oral   Take 1 tablet (500 mg total) by mouth daily.   7 tablet   0   . ondansetron (ZOFRAN) 4 MG tablet   Oral   Take 1 tablet (4 mg total) by mouth every 6 (six) hours.   12 tablet   0   . oxymetazoline (AFRIN NASAL SPRAY) 0.05 % nasal spray  Each Nare   Place 1 spray into both nostrils 2 (two) times daily.   30 mL   0    BP 108/61  Pulse 64  Temp(Src) 98.8 F (37.1 C) (Oral)  Resp 18  Ht 5\' 6"  (1.676 m)  Wt 245 lb (111.131 kg)  BMI 39.56 kg/m2  SpO2 98%  LMP 12/07/2013 Physical Exam  Constitutional: She is oriented to person, place, and time. She appears well-developed and well-nourished. No distress.  HENT:  Head: Normocephalic and atraumatic.   Mouth/Throat: No oropharyngeal exudate.  Eyes: Pupils are equal, round, and reactive to light.  Neck: Normal range of motion. Neck supple.  Cardiovascular: Normal rate, regular rhythm and normal heart sounds.  Exam reveals no gallop and no friction rub.   No murmur heard. Pulmonary/Chest: Effort normal and breath sounds normal. No respiratory distress. She has no wheezes. She has no rales.  Abdominal: Soft. Bowel sounds are normal. She exhibits no distension and no mass. There is no tenderness. There is no rebound and no guarding.  Musculoskeletal: Normal range of motion. She exhibits no edema and no tenderness.  Neurological: She is alert and oriented to person, place, and time.  Skin: Skin is warm and dry.  Psychiatric: She has a normal mood and affect.    ED Course  Procedures (including critical care time) Labs Review Labs Reviewed  PREGNANCY, URINE  URINALYSIS, ROUTINE W REFLEX MICROSCOPIC   Imaging Review No results found.   EKG Interpretation None      MDM   Final diagnoses:  Gastroenteritis    Pt is a 23 y.o. female with Pmhx as above who presents with about 8 hrs of n/v, d/a.  On PE, VSS, pt in NAD. Cardiopulm & abdominal exam benign.  Suspect viral gastroenteritis. Pt feeling much improved after IVF, zofran and was able to totlerate PO.  Will d/c home w/ zofran and have give instructions for continued supportive care at home.  Return precautions given for new or worsening symptoms including inability to tolerate PO, localized abdominal pain, fever.          Shanna CiscoMegan E Docherty, MD 12/21/13 2015

## 2013-12-20 NOTE — ED Notes (Signed)
Numerous episodes of vomiting and diarrhea that started at 0200 this am.

## 2013-12-20 NOTE — Discharge Instructions (Signed)
Viral Gastroenteritis Viral gastroenteritis is also known as stomach flu. This condition affects the stomach and intestinal tract. It can cause sudden diarrhea and vomiting. The illness typically lasts 3 to 8 days. Most people develop an immune response that eventually gets rid of the virus. While this natural response develops, the virus can make you quite ill. CAUSES  Many different viruses can cause gastroenteritis, such as rotavirus or noroviruses. You can catch one of these viruses by consuming contaminated food or water. You may also catch a virus by sharing utensils or other personal items with an infected person or by touching a contaminated surface. SYMPTOMS  The most common symptoms are diarrhea and vomiting. These problems can cause a severe loss of body fluids (dehydration) and a body salt (electrolyte) imbalance. Other symptoms may include:  Fever.  Headache.  Fatigue.  Abdominal pain. DIAGNOSIS  Your caregiver can usually diagnose viral gastroenteritis based on your symptoms and a physical exam. A stool sample may also be taken to test for the presence of viruses or other infections. TREATMENT  This illness typically goes away on its own. Treatments are aimed at rehydration. The most serious cases of viral gastroenteritis involve vomiting so severely that you are not able to keep fluids down. In these cases, fluids must be given through an intravenous line (IV). HOME CARE INSTRUCTIONS   Drink enough fluids to keep your urine clear or pale yellow. Drink small amounts of fluids frequently and increase the amounts as tolerated.  Ask your caregiver for specific rehydration instructions.  Avoid:  Foods high in sugar.  Alcohol.  Carbonated drinks.  Tobacco.  Juice.  Caffeine drinks.  Extremely hot or cold fluids.  Fatty, greasy foods.  Too much intake of anything at one time.  Dairy products until 24 to 48 hours after diarrhea stops.  You may consume probiotics.  Probiotics are active cultures of beneficial bacteria. They may lessen the amount and number of diarrheal stools in adults. Probiotics can be found in yogurt with active cultures and in supplements.  Wash your hands well to avoid spreading the virus.  Only take over-the-counter or prescription medicines for pain, discomfort, or fever as directed by your caregiver. Do not give aspirin to children. Antidiarrheal medicines are not recommended.  Ask your caregiver if you should continue to take your regular prescribed and over-the-counter medicines.  Keep all follow-up appointments as directed by your caregiver. SEEK IMMEDIATE MEDICAL CARE IF:   You are unable to keep fluids down.  You do not urinate at least once every 6 to 8 hours.  You develop shortness of breath.  You notice blood in your stool or vomit. This may look like coffee grounds.  You have abdominal pain that increases or is concentrated in one small area (localized).  You have persistent vomiting or diarrhea.  You have a fever.  The patient is a child younger than 3 months, and he or she has a fever.  The patient is a child older than 3 months, and he or she has a fever and persistent symptoms.  The patient is a child older than 3 months, and he or she has a fever and symptoms suddenly get worse.  The patient is a baby, and he or she has no tears when crying. MAKE SURE YOU:   Understand these instructions.  Will watch your condition.  Will get help right away if you are not doing well or get worse. Document Released: 09/27/2005 Document Revised: 12/20/2011 Document Reviewed: 07/14/2011   [employer] Patient Information 2014 [employer].  

## 2014-02-26 ENCOUNTER — Emergency Department (HOSPITAL_BASED_OUTPATIENT_CLINIC_OR_DEPARTMENT_OTHER)
Admission: EM | Admit: 2014-02-26 | Discharge: 2014-02-26 | Disposition: A | Discharge status: Home / Self Care | Payer: Medicaid Other | Attending: Emergency Medicine | Admitting: Emergency Medicine

## 2014-02-26 ENCOUNTER — Encounter (HOSPITAL_BASED_OUTPATIENT_CLINIC_OR_DEPARTMENT_OTHER): Payer: Self-pay | Admitting: Emergency Medicine

## 2014-02-26 DIAGNOSIS — Z8701 Personal history of pneumonia (recurrent): Secondary | ICD-10-CM | POA: Insufficient documentation

## 2014-02-26 DIAGNOSIS — R1110 Vomiting, unspecified: Secondary | ICD-10-CM

## 2014-02-26 DIAGNOSIS — R519 Headache, unspecified: Secondary | ICD-10-CM

## 2014-02-26 DIAGNOSIS — G43909 Migraine, unspecified, not intractable, without status migrainosus: Secondary | ICD-10-CM | POA: Insufficient documentation

## 2014-02-26 DIAGNOSIS — R51 Headache: Secondary | ICD-10-CM

## 2014-02-26 DIAGNOSIS — Z79899 Other long term (current) drug therapy: Secondary | ICD-10-CM | POA: Insufficient documentation

## 2014-02-26 DIAGNOSIS — Z87891 Personal history of nicotine dependence: Secondary | ICD-10-CM | POA: Insufficient documentation

## 2014-02-26 DIAGNOSIS — Z880 Allergy status to penicillin: Secondary | ICD-10-CM | POA: Insufficient documentation

## 2014-02-26 DIAGNOSIS — R197 Diarrhea, unspecified: Secondary | ICD-10-CM | POA: Insufficient documentation

## 2014-02-26 DIAGNOSIS — Z3202 Encounter for pregnancy test, result negative: Secondary | ICD-10-CM | POA: Insufficient documentation

## 2014-02-26 DIAGNOSIS — R350 Frequency of micturition: Secondary | ICD-10-CM | POA: Insufficient documentation

## 2014-02-26 DIAGNOSIS — Z8719 Personal history of other diseases of the digestive system: Secondary | ICD-10-CM | POA: Insufficient documentation

## 2014-02-26 LAB — URINE MICROSCOPIC-ADD ON

## 2014-02-26 LAB — CBC WITH DIFFERENTIAL/PLATELET
Basophils Absolute: 0 10*3/uL (ref 0.0–0.1)
Basophils Relative: 0 % (ref 0–1)
EOS ABS: 0.2 10*3/uL (ref 0.0–0.7)
Eosinophils Relative: 3 % (ref 0–5)
HCT: 38.9 % (ref 36.0–46.0)
Hemoglobin: 12.9 g/dL (ref 12.0–15.0)
Lymphocytes Relative: 36 % (ref 12–46)
Lymphs Abs: 2.5 10*3/uL (ref 0.7–4.0)
MCH: 30.4 pg (ref 26.0–34.0)
MCHC: 33.2 g/dL (ref 30.0–36.0)
MCV: 91.7 fL (ref 78.0–100.0)
Monocytes Absolute: 0.4 10*3/uL (ref 0.1–1.0)
Monocytes Relative: 5 % (ref 3–12)
NEUTROS PCT: 56 % (ref 43–77)
Neutro Abs: 3.8 10*3/uL (ref 1.7–7.7)
Platelets: 283 10*3/uL (ref 150–400)
RBC: 4.24 MIL/uL (ref 3.87–5.11)
RDW: 12.9 % (ref 11.5–15.5)
WBC: 6.8 10*3/uL (ref 4.0–10.5)

## 2014-02-26 LAB — URINALYSIS, ROUTINE W REFLEX MICROSCOPIC
Bilirubin Urine: NEGATIVE
Glucose, UA: NEGATIVE mg/dL
HGB URINE DIPSTICK: NEGATIVE
Ketones, ur: NEGATIVE mg/dL
Nitrite: NEGATIVE
PH: 6.5 (ref 5.0–8.0)
Protein, ur: NEGATIVE mg/dL
SPECIFIC GRAVITY, URINE: 1.006 (ref 1.005–1.030)
Urobilinogen, UA: 0.2 mg/dL (ref 0.0–1.0)

## 2014-02-26 LAB — BASIC METABOLIC PANEL
BUN: 9 mg/dL (ref 6–23)
CHLORIDE: 106 meq/L (ref 96–112)
CO2: 26 mEq/L (ref 19–32)
Calcium: 9.6 mg/dL (ref 8.4–10.5)
Creatinine, Ser: 0.7 mg/dL (ref 0.50–1.10)
GFR calc Af Amer: >90 mL/min (ref >90)
Glucose, Bld: 102 mg/dL — ABNORMAL HIGH (ref 70–99)
POTASSIUM: 4.3 meq/L (ref 3.7–5.3)
Sodium: 144 mEq/L (ref 137–147)

## 2014-02-26 LAB — PREGNANCY, URINE: Preg Test, Ur: NEGATIVE

## 2014-02-26 MED ORDER — KETOROLAC TROMETHAMINE 30 MG/ML IJ SOLN
30.0000 mg | Freq: Once | INTRAMUSCULAR | Status: AC
  Administered 2014-02-26: 30 mg via INTRAVENOUS
  Filled 2014-02-26: qty 1

## 2014-02-26 MED ORDER — ONDANSETRON HCL 4 MG/2ML IJ SOLN
4.0000 mg | Freq: Once | INTRAMUSCULAR | Status: AC
  Administered 2014-02-26: 4 mg via INTRAVENOUS
  Filled 2014-02-26: qty 2

## 2014-02-26 MED ORDER — SODIUM CHLORIDE 0.9 % IV BOLUS (SEPSIS)
1000.0000 mL | Freq: Once | INTRAVENOUS | Status: AC
  Administered 2014-02-26: 1000 mL via INTRAVENOUS

## 2014-02-26 MED ORDER — DEXAMETHASONE SODIUM PHOSPHATE 10 MG/ML IJ SOLN
10.0000 mg | Freq: Once | INTRAMUSCULAR | Status: AC
  Administered 2014-02-26: 10 mg via INTRAVENOUS
  Filled 2014-02-26: qty 1

## 2014-02-26 MED ORDER — ONDANSETRON HCL 4 MG PO TABS: 4.0000 mg | ORAL_TABLET | Freq: Three times a day (TID) | ORAL | Status: DC | PRN

## 2014-02-26 NOTE — ED Notes (Signed)
Pt c/o migraine HA x4 days and nausea x1 week.

## 2014-02-26 NOTE — Discharge Instructions (Signed)
Nausea and Vomiting Nausea is a sick feeling that often comes before throwing up (vomiting). Vomiting is a reflex where stomach contents come out of your mouth. Vomiting can cause severe loss of body fluids (dehydration). Children and elderly adults can become dehydrated quickly, especially if they also have diarrhea. Nausea and vomiting are symptoms of a condition or disease. It is important to find the cause of your symptoms. CAUSES   Direct irritation of the stomach lining. This irritation can result from increased acid production (gastroesophageal reflux disease), infection, food poisoning, taking certain medicines (such as nonsteroidal anti-inflammatory drugs), alcohol use, or tobacco use.  Signals from the brain.These signals could be caused by a headache, heat exposure, an inner ear disturbance, increased pressure in the brain from injury, infection, a tumor, or a concussion, pain, emotional stimulus, or metabolic problems.  An obstruction in the gastrointestinal tract (bowel obstruction).  Illnesses such as diabetes, hepatitis, gallbladder problems, appendicitis, kidney problems, cancer, sepsis, atypical symptoms of a heart attack, or eating disorders.  Medical treatments such as chemotherapy and radiation.  Receiving medicine that makes you sleep (general anesthetic) during surgery. DIAGNOSIS Your caregiver may ask for tests to be done if the problems do not improve after a few days. Tests may also be done if symptoms are severe or if the reason for the nausea and vomiting is not clear. Tests may include:  Urine tests.  Blood tests.  Stool tests.  Cultures (to look for evidence of infection).  X-rays or other imaging studies. Test results can help your caregiver make decisions about treatment or the need for additional tests. TREATMENT You need to stay well hydrated. Drink frequently but in small amounts.You may wish to drink water, sports drinks, clear broth, or eat frozen  ice pops or gelatin dessert to help stay hydrated.When you eat, eating slowly may help prevent nausea.There are also some antinausea medicines that may help prevent nausea. HOME CARE INSTRUCTIONS   Take all medicine as directed by your caregiver.  If you do not have an appetite, do not force yourself to eat. However, you must continue to drink fluids.  If you have an appetite, eat a normal diet unless your caregiver tells you differently.  Eat a variety of complex carbohydrates (rice, wheat, potatoes, bread), lean meats, yogurt, fruits, and vegetables.  Avoid high-fat foods because they are more difficult to digest.  Drink enough water and fluids to keep your urine clear or pale yellow.  If you are dehydrated, ask your caregiver for specific rehydration instructions. Signs of dehydration may include:  Severe thirst.  Dry lips and mouth.  Dizziness.  Dark urine.  Decreasing urine frequency and amount.  Confusion.  Rapid breathing or pulse. SEEK IMMEDIATE MEDICAL CARE IF:   You have blood or brown flecks (like coffee grounds) in your vomit.  You have black or bloody stools.  You have a severe headache or stiff neck.  You are confused.  You have severe abdominal pain.  You have chest pain or trouble breathing.  You do not urinate at least once every 8 hours.  You develop cold or clammy skin.  You continue to vomit for longer than 24 to 48 hours.  You have a fever. MAKE SURE YOU:   Understand these instructions.  Will watch your condition.  Will get help right away if you are not doing well or get worse. Document Released: 09/27/2005 Document Revised: 12/20/2011 Document Reviewed: 02/24/2011 [facility] Patient Information 2014 [place]ExitCare, [place].  Migraine Headache A migraine  headache is an intense, throbbing pain on one or both sides of your head. A migraine can last for 30 minutes to several hours. CAUSES  The exact cause of a migraine headache is not always  known. However, a migraine may be caused when nerves in the brain become irritated and release chemicals that cause inflammation. This causes pain. Certain things may also trigger migraines, such as:  Alcohol.  Smoking.  Stress.  Menstruation.  Aged cheeses.  Foods or drinks that contain nitrates, glutamate, aspartame, or tyramine.  Lack of sleep.  Chocolate.  Caffeine.  Hunger.  Physical exertion.  Fatigue.  Medicines used to treat chest pain (nitroglycerine), birth control pills, estrogen, and some blood pressure medicines. SIGNS AND SYMPTOMS  Pain on one or both sides of your head.  Pulsating or throbbing pain.  Severe pain that prevents daily activities.  Pain that is aggravated by any physical activity.  Nausea, vomiting, or both.  Dizziness.  Pain with exposure to bright lights, loud noises, or activity.  General sensitivity to bright lights, loud noises, or smells. Before you get a migraine, you may get warning signs that a migraine is coming (aura). An aura may include:  Seeing flashing lights.  Seeing bright spots, halos, or zig-zag lines.  Having tunnel vision or blurred vision.  Having feelings of numbness or tingling.  Having trouble talking.  Having muscle weakness. DIAGNOSIS  A migraine headache is often diagnosed based on:  Symptoms.  Physical exam.  A CT scan or MRI of your head. These imaging tests cannot diagnose migraines, but they can help rule out other causes of headaches. TREATMENT Medicines may be given for pain and nausea. Medicines can also be given to help prevent recurrent migraines.  HOME CARE INSTRUCTIONS  Only take over-the-counter or prescription medicines for pain or discomfort as directed by your health care provider. The use of long-term narcotics is not recommended.  Lie down in a dark, quiet room when you have a migraine.  Keep a journal to find out what may trigger your migraine headaches. For example,  write down:  What you eat and drink.  How much sleep you get.  Any change to your diet or medicines.  Limit alcohol consumption.  Quit smoking if you smoke.  Get 7 9 hours of sleep, or as recommended by your health care provider.  Limit stress.  Keep lights dim if bright lights bother you and make your migraines worse. SEEK IMMEDIATE MEDICAL CARE IF:   Your migraine becomes severe.  You have a fever.  You have a stiff neck.  You have vision loss.  You have muscular weakness or loss of muscle control.  You start losing your balance or have trouble walking.  You feel faint or pass out.  You have severe symptoms that are different from your first symptoms. MAKE SURE YOU:   Understand these instructions.  Will watch your condition.  Will get help right away if you are not doing well or get worse. Document Released: 09/27/2005 Document Revised: 07/18/2013 Document Reviewed: 06/04/2013 [facility]ExitCare Patient Information 2014 [place]ExitCare, [place].

## 2014-02-26 NOTE — ED Provider Notes (Signed)
CSN: [id number]     Arrival date & time 02/26/14  0804 History   First MD Initiated Contact with Patient 02/26/14 [phone]     Chief Complaint  Patient presents with  . Headache  . Emesis     (Consider location/radiation/quality/duration/timing/severity/associated sxs/prior Treatment) Patient is a 23 y.o. female presenting with headaches and vomiting.  Headache Associated symptoms: vomiting   Emesis Associated symptoms: headaches    Pt with history of migraines reports she has had nausea, vomiting and diarrhea for about a week, able to keep down some liquids, but unable to keep down much solids. She has had some increased urinary frequency, but no dysuria or fever. She subsequently developed a L sided headache 4 days ago, associated with phonophobia, but no photophobia. She is unsure if she may be pregnant or not.  Past Medical History  Diagnosis Date  . Pneumonia   . Acid reflux   . Migraine    History reviewed. No pertinent past surgical history. No family history on file. History  Substance Use Topics  . Smoking status: Former Smoker    Quit date: 03/14/2013  . Smokeless tobacco: Never Used  . Alcohol Use: Yes     Comment: occassionally   OB History   Grav Para Term Preterm Abortions TAB SAB Ect Mult Living                 Review of Systems  Gastrointestinal: Positive for vomiting.  Neurological: Positive for headaches.   All other systems reviewed and are negative except as noted in HPI.     Allergies  Zithromax and Penicillins  Home Medications   Prior to Admission medications   Medication Sig Start Date End Date Taking? Authorizing Provider  levofloxacin (LEVAQUIN) 500 MG tablet Take 1 tablet (500 mg total) by mouth daily. 12/07/13   Izola PriceFrances C. Sanford, PA-C  ondansetron (ZOFRAN) 4 MG tablet Take 1 tablet (4 mg total) by mouth every 6 (six) hours. 12/20/13   Shanna CiscoMegan E Docherty, MD  oxymetazoline (AFRIN NASAL SPRAY) 0.05 % nasal spray Place 1 spray into both  nostrils 2 (two) times daily. 12/07/13   Izola PriceFrances C. Sanford, PA-C   BP 144/83  Pulse 84  Temp(Src) 97.8 F (36.6 C) (Oral)  Resp 18  Wt 245 lb (111.131 kg)  SpO2 100%  LMP 02/11/2014 Physical Exam  Nursing note and vitals reviewed. Constitutional: She is oriented to person, place, and time. She appears well-developed and well-nourished.  HENT:  Head: Normocephalic and atraumatic.  Eyes: EOM are normal. Pupils are equal, round, and reactive to light.  Neck: Normal range of motion. Neck supple.  Cardiovascular: Normal rate, normal heart sounds and intact distal pulses.   Pulmonary/Chest: Effort normal and breath sounds normal.  Abdominal: Bowel sounds are normal. She exhibits no distension. There is no tenderness.  Musculoskeletal: Normal range of motion. She exhibits no edema and no tenderness.  Neurological: She is alert and oriented to person, place, and time. She has normal strength. No cranial nerve deficit or sensory deficit.  Skin: Skin is warm and dry. No rash noted.  Psychiatric: She has a normal mood and affect.    ED Course  Procedures (including critical care time) Labs Review Labs Reviewed  BASIC METABOLIC PANEL - Abnormal; Notable for the following:    Glucose, Bld 102 (*)    All other components within normal limits  URINALYSIS, ROUTINE W REFLEX MICROSCOPIC - Abnormal; Notable for the following:    Leukocytes, UA MODERATE (*)  All other components within normal limits  URINE MICROSCOPIC-ADD ON - Abnormal; Notable for the following:    Squamous Epithelial / LPF FEW (*)    Bacteria, UA FEW (*)    All other components within normal limits  CBC WITH DIFFERENTIAL  PREGNANCY, URINE    Imaging Review No results found.   EKG Interpretation None      MDM   Final diagnoses:  Headache  Vomiting   Labs unremarkable. Not pregnant. Feeling better after IVF and meds. Ready to go home.    Charles B. Bernette MayersSheldon, MD 02/26/14 [phone]

## 2014-04-29 ENCOUNTER — Encounter (HOSPITAL_BASED_OUTPATIENT_CLINIC_OR_DEPARTMENT_OTHER): Payer: Self-pay | Admitting: Emergency Medicine

## 2014-04-29 ENCOUNTER — Emergency Department (HOSPITAL_BASED_OUTPATIENT_CLINIC_OR_DEPARTMENT_OTHER)
Admission: EM | Admit: 2014-04-29 | Discharge: 2014-04-29 | Disposition: A | Discharge status: Home / Self Care | Payer: Medicaid Other | Attending: Emergency Medicine | Admitting: Emergency Medicine

## 2014-04-29 DIAGNOSIS — Z8679 Personal history of other diseases of the circulatory system: Secondary | ICD-10-CM | POA: Insufficient documentation

## 2014-04-29 DIAGNOSIS — Z8719 Personal history of other diseases of the digestive system: Secondary | ICD-10-CM | POA: Insufficient documentation

## 2014-04-29 DIAGNOSIS — H6504 Acute serous otitis media, recurrent, right ear: Secondary | ICD-10-CM

## 2014-04-29 DIAGNOSIS — H6590 Unspecified nonsuppurative otitis media, unspecified ear: Secondary | ICD-10-CM | POA: Insufficient documentation

## 2014-04-29 DIAGNOSIS — Z87891 Personal history of nicotine dependence: Secondary | ICD-10-CM | POA: Insufficient documentation

## 2014-04-29 DIAGNOSIS — Z880 Allergy status to penicillin: Secondary | ICD-10-CM | POA: Insufficient documentation

## 2014-04-29 DIAGNOSIS — Z8701 Personal history of pneumonia (recurrent): Secondary | ICD-10-CM | POA: Insufficient documentation

## 2014-04-29 MED ORDER — CLINDAMYCIN HCL 300 MG PO CAPS: 300.0000 mg | ORAL_CAPSULE | Freq: Three times a day (TID) | ORAL | Status: DC

## 2014-04-29 NOTE — ED Notes (Signed)
Patient states she has a one and one half week history of right ear aching, and decreased intermittent hearing.  Denies recent uri.

## 2014-04-29 NOTE — ED Provider Notes (Signed)
CSN: [id number]     Arrival date & time 04/29/14  1036 History   First MD Initiated Contact with Patient 04/29/14 1037     Chief Complaint  Patient presents with  . Otalgia    right ear     (Consider location/radiation/quality/duration/timing/severity/associated sxs/prior Treatment) HPI Comments: States that she feels like she can't hear out of her ear and she has had some drainage  Patient is a 23 y.o. female presenting with ear pain. The history is provided by the patient. A language interpreter was used.  Otalgia Location:  Right Quality:  Aching Severity:  Moderate Onset quality:  Gradual Timing:  Constant Progression:  Unchanged Chronicity:  New Associated symptoms: no fever and no sore throat     Past Medical History  Diagnosis Date  . Pneumonia   . Acid reflux   . Migraine    History reviewed. No pertinent past surgical history. No family history on file. History  Substance Use Topics  . Smoking status: Former Smoker    Quit date: 03/14/2013  . Smokeless tobacco: Never Used  . Alcohol Use: Yes     Comment: occassionally   OB History   Grav Para Term Preterm Abortions TAB SAB Ect Mult Living                 Review of Systems  Constitutional: Negative for fever.  HENT: Positive for ear pain. Negative for sore throat.   Respiratory: Negative.   Cardiovascular: Negative.       Allergies  Zithromax and Penicillins  Home Medications   Prior to Admission medications   Medication Sig Start Date End Date Taking? Authorizing Provider  ondansetron (ZOFRAN) 4 MG tablet Take 1 tablet (4 mg total) by mouth every 8 (eight) hours as needed for nausea or vomiting. 02/26/14  Yes Charles B. Bernette MayersSheldon, MD  clindamycin (CLEOCIN) 300 MG capsule Take 1 capsule (300 mg total) by mouth 3 (three) times daily. 04/29/14   Teressa LowerVrinda Kwana Ringel, NP   BP 103/69  Pulse 51  Temp(Src) 98.2 F (36.8 C) (Oral)  Resp 18  Ht 5\' 4"  (1.626 m)  Wt 250 lb (113.399 kg)  BMI 42.89 kg/m2   SpO2 98%  LMP 04/15/2014 Physical Exam  Nursing note and vitals reviewed. Constitutional: She is oriented to person, place, and time. She appears well-developed and well-nourished.  HENT:  Right Ear: Tympanic membrane is erythematous and bulging. A middle ear effusion is present.  Left Ear: External ear normal.  Mouth/Throat: Oropharynx is clear and moist.  Eyes: Conjunctivae and EOM are normal.  Cardiovascular: Normal rate and regular rhythm.   Pulmonary/Chest: Effort normal and breath sounds normal.  Musculoskeletal: Normal range of motion.  Neurological: She is alert and oriented to person, place, and time.    ED Course  Procedures (including critical care time) Labs Review Labs Reviewed - No data to display  Imaging Review No results found.   EKG Interpretation None      MDM   Final diagnoses:  Recurrent serous otitis media of right ear, unspecified chronicity    Will treat with clindamycin since she is allergic to pcn and zithromax    Teressa LowerVrinda Wynetta Seith, NP 04/29/14 1121

## 2014-04-29 NOTE — ED Provider Notes (Signed)
History/physical exam/procedure(s) were performed by non-physician practitioner and as supervising physician I was immediately available for consultation/collaboration. I have reviewed all notes and am in agreement with care and plan.   Hilario Quarryanielle S Seraiah Nowack, MD 04/29/14 [phone]

## 2014-06-12 ENCOUNTER — Encounter (HOSPITAL_BASED_OUTPATIENT_CLINIC_OR_DEPARTMENT_OTHER): Payer: Self-pay | Admitting: Emergency Medicine

## 2014-06-12 ENCOUNTER — Emergency Department (HOSPITAL_BASED_OUTPATIENT_CLINIC_OR_DEPARTMENT_OTHER)
Admission: EM | Admit: 2014-06-12 | Discharge: 2014-06-12 | Disposition: A | Discharge status: Home / Self Care | Payer: No Typology Code available for payment source | Attending: Emergency Medicine | Admitting: Emergency Medicine

## 2014-06-12 DIAGNOSIS — Z79899 Other long term (current) drug therapy: Secondary | ICD-10-CM | POA: Insufficient documentation

## 2014-06-12 DIAGNOSIS — K625 Hemorrhage of anus and rectum: Secondary | ICD-10-CM | POA: Insufficient documentation

## 2014-06-12 DIAGNOSIS — Z8679 Personal history of other diseases of the circulatory system: Secondary | ICD-10-CM | POA: Insufficient documentation

## 2014-06-12 DIAGNOSIS — E669 Obesity, unspecified: Secondary | ICD-10-CM | POA: Insufficient documentation

## 2014-06-12 DIAGNOSIS — Z87891 Personal history of nicotine dependence: Secondary | ICD-10-CM | POA: Insufficient documentation

## 2014-06-12 DIAGNOSIS — K921 Melena: Secondary | ICD-10-CM

## 2014-06-12 DIAGNOSIS — Z3202 Encounter for pregnancy test, result negative: Secondary | ICD-10-CM | POA: Insufficient documentation

## 2014-06-12 DIAGNOSIS — Z792 Long term (current) use of antibiotics: Secondary | ICD-10-CM | POA: Insufficient documentation

## 2014-06-12 DIAGNOSIS — Z8701 Personal history of pneumonia (recurrent): Secondary | ICD-10-CM | POA: Insufficient documentation

## 2014-06-12 DIAGNOSIS — K219 Gastro-esophageal reflux disease without esophagitis: Secondary | ICD-10-CM | POA: Insufficient documentation

## 2014-06-12 DIAGNOSIS — Z880 Allergy status to penicillin: Secondary | ICD-10-CM | POA: Insufficient documentation

## 2014-06-12 LAB — BASIC METABOLIC PANEL
Anion gap: 12 (ref 5–15)
BUN: 8 mg/dL (ref 6–23)
CHLORIDE: 105 meq/L (ref 96–112)
CO2: 26 mEq/L (ref 19–32)
Calcium: 9.9 mg/dL (ref 8.4–10.5)
Creatinine, Ser: 0.8 mg/dL (ref 0.50–1.10)
Glucose, Bld: 96 mg/dL (ref 70–99)
POTASSIUM: 3.9 meq/L (ref 3.7–5.3)
Sodium: 143 mEq/L (ref 137–147)

## 2014-06-12 LAB — CBC
HCT: 37 % (ref 36.0–46.0)
Hemoglobin: 12 g/dL (ref 12.0–15.0)
MCH: 29.9 pg (ref 26.0–34.0)
MCHC: 32.4 g/dL (ref 30.0–36.0)
MCV: 92.3 fL (ref 78.0–100.0)
PLATELETS: 275 10*3/uL (ref 150–400)
RBC: 4.01 MIL/uL (ref 3.87–5.11)
RDW: 13 % (ref 11.5–15.5)
WBC: 9.3 10*3/uL (ref 4.0–10.5)

## 2014-06-12 LAB — OCCULT BLOOD X 1 CARD TO LAB, STOOL: FECAL OCCULT BLD: NEGATIVE

## 2014-06-12 LAB — PREGNANCY, URINE: Preg Test, Ur: NEGATIVE

## 2014-06-12 NOTE — ED Provider Notes (Signed)
CSN: [id number]     Arrival date & time 06/12/14  0115 History   First MD Initiated Contact with Patient 06/12/14 0133     Chief Complaint  Patient presents with  . Rectal Bleeding     (Consider location/radiation/quality/duration/timing/severity/associated sxs/prior Treatment) HPI 23 year old female presents with blood while defecating. She states that approximately one to 2 hours ago she got up in the middle the night to go the bathroom. She says she is pushing like normal and saw a little bit of blood in the toilet water. She did not think the blood was mixed in with the stool. She not have significant pain. She states she was not straining. She's not been constipated and had a bowel movement earlier in the day. She then pushed again had another bowel movement with a little bit of blood. She estimates the total blood amount was about a half-dollar size. Denies feeling lightheaded. The patient has not had blood in her stool before this. No rectal pain or hemorrhoids. She has been feeling nauseous for the last couple days with vague upper abdominal pain. She's not having this pain currently.  Past Medical History  Diagnosis Date  . Pneumonia   . Acid reflux   . Migraine    History reviewed. No pertinent past surgical history. History reviewed. No pertinent family history. History  Substance Use Topics  . Smoking status: Former Smoker    Quit date: 03/14/2013  . Smokeless tobacco: Never Used  . Alcohol Use: Yes     Comment: occassionally   OB History   Grav Para Term Preterm Abortions TAB SAB Ect Mult Living                 Review of Systems  Constitutional: Negative for fever and fatigue.  Gastrointestinal: Positive for nausea, abdominal pain and blood in stool. Negative for vomiting, diarrhea and constipation.  Genitourinary: Negative for dysuria.  Neurological: Negative for light-headedness.  All other systems reviewed and are negative.     Allergies  Zithromax and  Penicillins  Home Medications   Prior to Admission medications   Medication Sig Start Date End Date Taking? Authorizing Provider  clindamycin (CLEOCIN) 300 MG capsule Take 1 capsule (300 mg total) by mouth 3 (three) times daily. 04/29/14   Teressa Lower, NP  ondansetron (ZOFRAN) 4 MG tablet Take 1 tablet (4 mg total) by mouth every 8 (eight) hours as needed for nausea or vomiting. 02/26/14   Charles B. Bernette Mayers, MD   BP 146/85  Pulse 63  Temp(Src) 98.1 F (36.7 C) (Oral)  Resp 18  Ht  (1.626 m)  Wt 260 lb (117.935 kg)  BMI 44.61 kg/m2  SpO2 100%  LMP 05/19/2014 Physical Exam  Nursing note and vitals reviewed. Constitutional: She is oriented to person, place, and time. She appears well-developed and well-nourished. No distress.  Obese  HENT:  Head: Normocephalic and atraumatic.  Right Ear: External ear normal.  Left Ear: External ear normal.  Nose: Nose normal.  Eyes: Right eye exhibits no discharge. Left eye exhibits no discharge.  Cardiovascular: Normal rate, regular rhythm and normal heart sounds.   Pulmonary/Chest: Effort normal and breath sounds normal.  Abdominal: Soft. She exhibits no distension. There is no tenderness.  Genitourinary: Rectal exam shows no external hemorrhoid, no internal hemorrhoid, no mass and no tenderness.  Nurse chaperone present for rectal exam. No hemorrhoids noted. No tenderness on rectal exam. No stool obtained and no gross blood on rectal exam.  Neurological: She is  alert and oriented to person, place, and time.  Skin: Skin is warm and dry.    ED Course  Procedures (including critical care time) Labs Review Labs Reviewed  CBC  BASIC METABOLIC PANEL  OCCULT BLOOD X 1 CARD TO LAB, STOOL  PREGNANCY, URINE  POC OCCULT BLOOD, ED    Imaging Review No results found.   EKG Interpretation None      MDM   Final diagnoses:  Bloody stool    Patient with mild right bright red blood per rectum earlier this evening. No signs or  symptoms that she is dehydrated or anemic. At this time this is likely related to her bowel movement. She does not have any fevers or abdominal pain to suggest an inflammatory infectious process. This time we'll refer her back to her PCP and advised to return precautions.    Audree Camel, MD 06/12/14 [phone]

## 2014-06-12 NOTE — Discharge Instructions (Signed)
Bloody Stools  Bloody stools often mean that there is a problem in the digestive tract. Your caregiver may use the term "melena" to describe black, tarry, and bad smelling stools or "hematochezia" to describe red or maroon-colored stools. Blood seen in the stool can be caused by bleeding anywhere along the intestinal tract.   A black stool usually means that blood is coming from the upper part of the gastrointestinal tract (esophagus, stomach, or small bowel). Passing maroon-colored stools or bright red blood usually means that blood is coming from lower down in the large bowel or the rectum. However, sometimes massive bleeding in the stomach or small intestine can cause bright red bloody stools.   Consuming black licorice, lead, iron pills, medicines containing bismuth subsalicylate, or blueberries can also cause black stools. Your caregiver can test black stools to see if blood is present.  It is important that the cause of the bleeding be found. Treatment can then be started, and the problem can be corrected. Rectal bleeding may not be serious, but you should not assume everything is okay until you know the cause. It is very important to follow up with your caregiver or a specialist in gastrointestinal problems.  CAUSES   Blood in the stools can come from various underlying causes. Often, the cause is not found during your first visit. Testing is often needed to discover the cause of bleeding in the gastrointestinal tract. Causes range from simple to serious or even life-threatening. Possible causes include:  · Hemorrhoids. These are veins that are full of blood (engorged) in the rectum. They cause pain, inflammation, and may bleed.  · Anal fissures. These are areas of painful tearing which may bleed. They are often caused by passing hard stool.  · Diverticulosis. These are pouches that form on the colon over time, with age, and may bleed significantly.  · Diverticulitis. This is inflammation in areas with  diverticulosis. It can cause pain, fever, and bloody stools, although bleeding is rare.  · Proctitis and colitis. These are inflamed areas of the rectum or colon. They may cause pain, fever, and bloody stools.  · Polyps and cancer. Colon cancer is a leading cause of preventable cancer death. It often starts out as precancerous polyps that can be removed during a colonoscopy, preventing progression into cancer. Sometimes, polyps and cancer may cause rectal bleeding.  · Gastritis and ulcers. Bleeding from the upper gastrointestinal tract (near the stomach) may travel through the intestines and produce black, sometimes tarry, often bad smelling stools. In certain cases, if the bleeding is fast enough, the stools may not be black, but red and the condition may be life-threatening.  SYMPTOMS   You may have stools that are bright red and bloody, that are normal color with blood on them, or that are dark black and tarry. In some cases, you may only have blood in the toilet bowl. Any of these cases need medical care. You may also have:  · Pain at the anus or anywhere in the rectum.  · Lightheadedness or feeling faint.  · Extreme weakness.  · Nausea or vomiting.  · Fever.  DIAGNOSIS  Your caregiver may use the following methods to find the cause of your bleeding:  · Taking a medical history. Age is important. Older people tend to develop polyps and cancer more often. If there is anal pain and a hard, large stool associated with bleeding, a tear of the anus may be the cause. If blood drips into the toilet after a bowel movement, bleeding hemorrhoids may be the   problem. The color and frequency of the bleeding are additional considerations. In most cases, the medical history provides clues, but seldom the final answer.  · A visual and finger (digital) exam. Your caregiver will inspect the anal area, looking for tears and hemorrhoids. A finger exam can provide information when there is tenderness or a growth inside. In men, the  prostate is also examined.  · Endoscopy. Several types of small, long scopes (endoscopes) are used to view the colon.  ¨ In the office, your caregiver may use a rigid, or more commonly, a flexible viewing sigmoidoscope. This exam is called flexible sigmoidoscopy. It is performed in 5 to 10 minutes.  ¨ A more thorough exam is accomplished with a colonoscope. It allows your caregiver to view the entire 5 to 6 foot long colon. Medicine to help you relax (sedative) is usually given for this exam. Frequently, a bleeding lesion may be present beyond the reach of the sigmoidoscope. So, a colonoscopy may be the best exam to start with. Both exams are usually done on an outpatient basis. This means the patient does not stay overnight in the hospital or surgery center.  ¨ An upper endoscopy may be needed to examine your stomach. Sedation is used and a flexible endoscope is put in your mouth, down to your stomach.  · A barium enema X-ray. This is an X-ray exam. It uses liquid barium inserted by enema into the rectum. This test alone may not identify an actual bleeding point. X-rays highlight abnormal shadows, such as those made by lumps (tumors), diverticuli, or colitis.  TREATMENT   Treatment depends on the cause of your bleeding.   · For bleeding from the stomach or colon, the caregiver doing your endoscopy or colonoscopy may be able to stop the bleeding as part of the procedure.  · Inflammation or infection of the colon can be treated with medicines.  · Many rectal problems can be treated with creams, suppositories, or warm baths.  · Surgery is sometimes needed.  · Blood transfusions are sometimes needed if you have lost a lot of blood.  · For any bleeding problem, let your caregiver know if you take aspirin or other blood thinners regularly.  HOME CARE INSTRUCTIONS   · Take any medicines exactly as prescribed.  · Keep your stools soft by eating a diet high in fiber. Prunes (1 to 3 a day) work well for many people.  · Drink  enough water and fluids to keep your urine clear or pale yellow.  · Take sitz baths if advised. A sitz bath is when you sit in a bathtub with warm water for 10 to 15 minutes to soak, soothe, and cleanse the rectal area.  · If enemas or suppositories are advised, be sure you know how to use them. Tell your caregiver if you have problems with this.  · Monitor your bowel movements to look for signs of improvement or worsening.  SEEK MEDICAL CARE IF:   · You do not improve in the time expected.  · Your condition worsens after initial improvement.  · You develop any new symptoms.  SEEK IMMEDIATE MEDICAL CARE IF:   · You develop severe or prolonged rectal bleeding.  · You vomit blood.  · You feel weak or faint.  · You have a fever.  MAKE SURE YOU:  · Understand these instructions.  · Will watch your condition.  · Will get help right away if you are not doing well or get worse.    Document Released: 09/17/2002 Document Revised: 12/20/2011 Document Reviewed: 02/12/2011  [employer]® Patient Information ©2015 [employer]. This information is not intended to replace advice given to you by your health care provider. Make sure you discuss any questions you have with your health care provider.

## 2014-07-01 ENCOUNTER — Encounter (HOSPITAL_BASED_OUTPATIENT_CLINIC_OR_DEPARTMENT_OTHER): Payer: Self-pay | Admitting: Emergency Medicine

## 2014-07-01 ENCOUNTER — Emergency Department (HOSPITAL_BASED_OUTPATIENT_CLINIC_OR_DEPARTMENT_OTHER)
Admission: EM | Admit: 2014-07-01 | Discharge: 2014-07-01 | Disposition: A | Discharge status: Home / Self Care | Payer: Self-pay | Attending: Emergency Medicine | Admitting: Emergency Medicine

## 2014-07-01 ENCOUNTER — Emergency Department (HOSPITAL_BASED_OUTPATIENT_CLINIC_OR_DEPARTMENT_OTHER): Payer: Self-pay

## 2014-07-01 DIAGNOSIS — M542 Cervicalgia: Secondary | ICD-10-CM | POA: Insufficient documentation

## 2014-07-01 DIAGNOSIS — Z8719 Personal history of other diseases of the digestive system: Secondary | ICD-10-CM | POA: Insufficient documentation

## 2014-07-01 DIAGNOSIS — J3489 Other specified disorders of nose and nasal sinuses: Secondary | ICD-10-CM | POA: Insufficient documentation

## 2014-07-01 DIAGNOSIS — Z8701 Personal history of pneumonia (recurrent): Secondary | ICD-10-CM | POA: Insufficient documentation

## 2014-07-01 DIAGNOSIS — R109 Unspecified abdominal pain: Secondary | ICD-10-CM | POA: Insufficient documentation

## 2014-07-01 DIAGNOSIS — M546 Pain in thoracic spine: Secondary | ICD-10-CM | POA: Insufficient documentation

## 2014-07-01 DIAGNOSIS — R350 Frequency of micturition: Secondary | ICD-10-CM | POA: Insufficient documentation

## 2014-07-01 DIAGNOSIS — Z8679 Personal history of other diseases of the circulatory system: Secondary | ICD-10-CM | POA: Insufficient documentation

## 2014-07-01 DIAGNOSIS — Z792 Long term (current) use of antibiotics: Secondary | ICD-10-CM | POA: Insufficient documentation

## 2014-07-01 DIAGNOSIS — Z3202 Encounter for pregnancy test, result negative: Secondary | ICD-10-CM | POA: Insufficient documentation

## 2014-07-01 DIAGNOSIS — R110 Nausea: Secondary | ICD-10-CM | POA: Insufficient documentation

## 2014-07-01 DIAGNOSIS — Z87891 Personal history of nicotine dependence: Secondary | ICD-10-CM | POA: Insufficient documentation

## 2014-07-01 DIAGNOSIS — R197 Diarrhea, unspecified: Secondary | ICD-10-CM | POA: Insufficient documentation

## 2014-07-01 DIAGNOSIS — R51 Headache: Secondary | ICD-10-CM | POA: Insufficient documentation

## 2014-07-01 DIAGNOSIS — M62838 Other muscle spasm: Secondary | ICD-10-CM | POA: Insufficient documentation

## 2014-07-01 DIAGNOSIS — R6883 Chills (without fever): Secondary | ICD-10-CM | POA: Insufficient documentation

## 2014-07-01 DIAGNOSIS — Z880 Allergy status to penicillin: Secondary | ICD-10-CM | POA: Insufficient documentation

## 2014-07-01 DIAGNOSIS — M25519 Pain in unspecified shoulder: Secondary | ICD-10-CM | POA: Insufficient documentation

## 2014-07-01 DIAGNOSIS — R209 Unspecified disturbances of skin sensation: Secondary | ICD-10-CM | POA: Insufficient documentation

## 2014-07-01 LAB — URINALYSIS, ROUTINE W REFLEX MICROSCOPIC
BILIRUBIN URINE: NEGATIVE
Glucose, UA: NEGATIVE mg/dL
Hgb urine dipstick: NEGATIVE
Ketones, ur: NEGATIVE mg/dL
Nitrite: NEGATIVE
PROTEIN: NEGATIVE mg/dL
Specific Gravity, Urine: 1.017 (ref 1.005–1.030)
Urobilinogen, UA: 0.2 mg/dL (ref 0.0–1.0)
pH: 6.5 (ref 5.0–8.0)

## 2014-07-01 LAB — URINE MICROSCOPIC-ADD ON

## 2014-07-01 LAB — PREGNANCY, URINE: Preg Test, Ur: NEGATIVE

## 2014-07-01 MED ORDER — NAPROXEN 500 MG PO TABS: 500.0000 mg | ORAL_TABLET | Freq: Two times a day (BID) | ORAL | Status: DC

## 2014-07-01 MED ORDER — CYCLOBENZAPRINE HCL 10 MG PO TABS: 10.0000 mg | ORAL_TABLET | Freq: Two times a day (BID) | ORAL | Status: DC | PRN

## 2014-07-01 NOTE — ED Notes (Signed)
np at bedside

## 2014-07-01 NOTE — ED Provider Notes (Signed)
CSN: [id number]     Arrival date & time 07/01/14  1702 History   First MD Initiated Contact with Patient 07/01/14 1751     Chief Complaint  Patient presents with  . Chills     (Consider location/radiation/quality/duration/timing/severity/associated sxs/prior Treatment) The history is provided by the patient.   Jamie Archer is a 23 y.o. female who presents to the ED with chills and feeling bad. She states that the right side of her body feels numb and this is the way she felt when she had pneumonia before. Patient states that today she started having pain in the right shoulder and it radiates down her back. She states that her right side gets numb and it last for about 20 minutes then the feeling is back. She also complains of neck pain. Currently she has no symptoms.  Past Medical History  Diagnosis Date  . Pneumonia   . Acid reflux   . Migraine    History reviewed. No pertinent past surgical history. No family history on file. History  Substance Use Topics  . Smoking status: Former Smoker    Quit date: 03/14/2013  . Smokeless tobacco: Never Used  . Alcohol Use: Yes     Comment: occassionally   OB History   Grav Para Term Preterm Abortions TAB SAB Ect Mult Living                 Review of Systems  Constitutional: Positive for chills. Negative for fever.  HENT: Positive for congestion and ear pain. Negative for mouth sores and sore throat.   Eyes: Negative for pain and redness.  Gastrointestinal: Positive for nausea, abdominal pain and diarrhea. Negative for vomiting.  Genitourinary: Positive for frequency. Negative for dysuria, urgency, vaginal bleeding and vaginal discharge.  Musculoskeletal: Positive for back pain and myalgias.  Skin: Negative for rash.  Neurological: Positive for headaches. Negative for syncope and light-headedness.  Psychiatric/Behavioral: Negative for confusion. The patient is not nervous/anxious.       Allergies  Zithromax and  Penicillins  Home Medications   Prior to Admission medications   Medication Sig Start Date End Date Taking? Authorizing Provider  clindamycin (CLEOCIN) 300 MG capsule Take 1 capsule (300 mg total) by mouth 3 (three) times daily. 04/29/14   Teressa Lower, NP  ondansetron (ZOFRAN) 4 MG tablet Take 1 tablet (4 mg total) by mouth every 8 (eight) hours as needed for nausea or vomiting. 02/26/14   Charles B. Bernette Mayers, MD   BP 174/84  Pulse 60  Temp(Src) 98.9 F (37.2 C) (Oral)  Resp 20  Ht  (1.626 m)  Wt 275 lb (124.739 kg)  BMI 47.18 kg/m2  SpO2 100%  LMP 05/19/2014 Physical Exam  Nursing note and vitals reviewed. Constitutional: She is oriented to person, place, and time. She appears well-developed and well-nourished.  HENT:  Head: Normocephalic and atraumatic.  Eyes: Conjunctivae and EOM are normal. Pupils are equal, round, and reactive to light.  Neck: Normal range of motion. Neck supple. Muscular tenderness present. No spinous process tenderness present.    Cardiovascular: Normal rate and regular rhythm.   Pulmonary/Chest: Effort normal and breath sounds normal.  Abdominal: Soft. Bowel sounds are normal. There is no tenderness.  Musculoskeletal: Normal range of motion.       Cervical back: She exhibits spasm.  Radial pulses equal, adequate circulation, good touch sensation. Strong grips and equal bilateral.   Neurological: She is alert and oriented to person, place, and time. She has normal strength. No  cranial nerve deficit or sensory deficit. Gait normal.  Reflex Scores:      Bicep reflexes are 2+ on the right side and 2+ on the left side.      Brachioradialis reflexes are 2+ on the right side and 2+ on the left side.      Patellar reflexes are 2+ on the right side and 2+ on the left side.      Achilles reflexes are 2+ on the right side and 2+ on the left side. Skin: Skin is warm and dry.  Psychiatric: She has a normal mood and affect. Her behavior is normal.    ED  Course  Procedures (including critical care time) Labs Review Results for orders placed during the hospital encounter of 07/01/14 (from the past 24 hour(s))  URINALYSIS, ROUTINE W REFLEX MICROSCOPIC     Status: Abnormal   Collection Time    07/01/14  5:15 PM      Result Value Ref Range   Color, Urine YELLOW  YELLOW   APPearance CLEAR  CLEAR   Specific Gravity, Urine 1.017  1.005 - 1.030   pH 6.5  5.0 - 8.0   Glucose, UA NEGATIVE  NEGATIVE mg/dL   Hgb urine dipstick NEGATIVE  NEGATIVE   Bilirubin Urine NEGATIVE  NEGATIVE   Ketones, ur NEGATIVE  NEGATIVE mg/dL   Protein, ur NEGATIVE  NEGATIVE mg/dL   Urobilinogen, UA 0.2  0.0 - 1.0 mg/dL   Nitrite NEGATIVE  NEGATIVE   Leukocytes, UA SMALL (*) NEGATIVE  PREGNANCY, URINE     Status: None   Collection Time    07/01/14  5:15 PM      Result Value Ref Range   Preg Test, Ur NEGATIVE  NEGATIVE  URINE MICROSCOPIC-ADD ON     Status: None   Collection Time    07/01/14  5:15 PM      Result Value Ref Range   Squamous Epithelial / LPF RARE  RARE   WBC, UA 3-6  <3 WBC/hpf   Bacteria, UA RARE  RARE     MDM  23 y.o. female with muscle spasm and right side pain that comes and goes. Stable for discharge without neuro deficits. I have reviewed this patient's vital signs, nurses notes, appropriate labs and discussed findings with the patient and plan of care. She voices understanding and agrees with plan.     Medication List    TAKE these medications       cyclobenzaprine 10 MG tablet  Commonly known as:  FLEXERIL  Take 1 tablet (10 mg total) by mouth 2 (two) times daily as needed for muscle spasms.     naproxen 500 MG tablet  Commonly known as:  NAPROSYN  Take 1 tablet (500 mg total) by mouth 2 (two) times daily.      ASK your doctor about these medications       clindamycin 300 MG capsule  Commonly known as:  CLEOCIN  Take 1 capsule (300 mg total) by mouth 3 (three) times daily.     ondansetron 4 MG tablet  Commonly known as:   ZOFRAN  Take 1 tablet (4 mg total) by mouth every 8 (eight) hours as needed for nausea or vomiting.           [address] [place], [place] 07/02/14 [phone]

## 2014-07-01 NOTE — ED Notes (Addendum)
C/o "chills and losing feeling on my side"-states "feel like i can move my arm but i can't" x 1 hour-reports hx of same when dx with pneumonia-pt with steady gait in to triage-MAE x4-states the feeling lasts 15-20 min-tylenol PTA

## 2014-07-02 NOTE — ED Provider Notes (Signed)
Medical screening examination/treatment/procedure(s) were performed by non-physician practitioner and as supervising physician I was immediately available for consultation/collaboration.     Jrake Rodriquez, MD 07/02/14 1509 

## 2014-07-03 ENCOUNTER — Emergency Department (HOSPITAL_BASED_OUTPATIENT_CLINIC_OR_DEPARTMENT_OTHER)
Admission: EM | Admit: 2014-07-03 | Discharge: 2014-07-03 | Disposition: A | Discharge status: Home / Self Care | Payer: Self-pay | Attending: Emergency Medicine | Admitting: Emergency Medicine

## 2014-07-03 ENCOUNTER — Encounter (HOSPITAL_BASED_OUTPATIENT_CLINIC_OR_DEPARTMENT_OTHER): Payer: Self-pay | Admitting: Emergency Medicine

## 2014-07-03 DIAGNOSIS — R1110 Vomiting, unspecified: Secondary | ICD-10-CM | POA: Insufficient documentation

## 2014-07-03 DIAGNOSIS — Z8701 Personal history of pneumonia (recurrent): Secondary | ICD-10-CM | POA: Insufficient documentation

## 2014-07-03 DIAGNOSIS — Z8719 Personal history of other diseases of the digestive system: Secondary | ICD-10-CM | POA: Insufficient documentation

## 2014-07-03 DIAGNOSIS — IMO0001 Reserved for inherently not codable concepts without codable children: Secondary | ICD-10-CM | POA: Insufficient documentation

## 2014-07-03 DIAGNOSIS — Z87891 Personal history of nicotine dependence: Secondary | ICD-10-CM | POA: Insufficient documentation

## 2014-07-03 DIAGNOSIS — Z79899 Other long term (current) drug therapy: Secondary | ICD-10-CM | POA: Insufficient documentation

## 2014-07-03 DIAGNOSIS — Z880 Allergy status to penicillin: Secondary | ICD-10-CM | POA: Insufficient documentation

## 2014-07-03 DIAGNOSIS — R1111 Vomiting without nausea: Secondary | ICD-10-CM

## 2014-07-03 DIAGNOSIS — Z791 Long term (current) use of non-steroidal anti-inflammatories (NSAID): Secondary | ICD-10-CM | POA: Insufficient documentation

## 2014-07-03 DIAGNOSIS — G43909 Migraine, unspecified, not intractable, without status migrainosus: Secondary | ICD-10-CM | POA: Insufficient documentation

## 2014-07-03 LAB — CBC WITH DIFFERENTIAL/PLATELET
BASOS PCT: 0 % (ref 0–1)
Basophils Absolute: 0 10*3/uL (ref 0.0–0.1)
Eosinophils Absolute: 0.2 10*3/uL (ref 0.0–0.7)
Eosinophils Relative: 3 % (ref 0–5)
HEMATOCRIT: 38 % (ref 36.0–46.0)
Hemoglobin: 12.4 g/dL (ref 12.0–15.0)
LYMPHS ABS: 2.3 10*3/uL (ref 0.7–4.0)
LYMPHS PCT: 33 % (ref 12–46)
MCH: 30.1 pg (ref 26.0–34.0)
MCHC: 32.6 g/dL (ref 30.0–36.0)
MCV: 92.2 fL (ref 78.0–100.0)
MONOS PCT: 6 % (ref 3–12)
Monocytes Absolute: 0.4 10*3/uL (ref 0.1–1.0)
NEUTROS PCT: 58 % (ref 43–77)
Neutro Abs: 4.1 10*3/uL (ref 1.7–7.7)
Platelets: 245 10*3/uL (ref 150–400)
RBC: 4.12 MIL/uL (ref 3.87–5.11)
RDW: 13.2 % (ref 11.5–15.5)
WBC: 6.9 10*3/uL (ref 4.0–10.5)

## 2014-07-03 LAB — COMPREHENSIVE METABOLIC PANEL
ALK PHOS: 62 U/L (ref 39–117)
ALT: 13 U/L (ref 0–35)
AST: 14 U/L (ref 0–37)
Albumin: 3.9 g/dL (ref 3.5–5.2)
Anion gap: 11 (ref 5–15)
BILIRUBIN TOTAL: 0.2 mg/dL — AB (ref 0.3–1.2)
BUN: 8 mg/dL (ref 6–23)
CHLORIDE: 105 meq/L (ref 96–112)
CO2: 26 meq/L (ref 19–32)
Calcium: 9.5 mg/dL (ref 8.4–10.5)
Creatinine, Ser: 0.7 mg/dL (ref 0.50–1.10)
GFR calc non Af Amer: >90 mL/min (ref >90)
GLUCOSE: 91 mg/dL (ref 70–99)
POTASSIUM: 4.2 meq/L (ref 3.7–5.3)
Sodium: 142 mEq/L (ref 137–147)
Total Protein: 7.3 g/dL (ref 6.0–8.3)

## 2014-07-03 MED ORDER — ONDANSETRON HCL 4 MG/2ML IJ SOLN
4.0000 mg | Freq: Once | INTRAMUSCULAR | Status: AC
  Administered 2014-07-03: 4 mg via INTRAMUSCULAR
  Filled 2014-07-03: qty 2

## 2014-07-03 MED ORDER — SODIUM CHLORIDE 0.9 % IV SOLN
Freq: Once | INTRAVENOUS | Status: AC
  Administered 2014-07-03: 13:00:00 via INTRAVENOUS

## 2014-07-03 MED ORDER — ONDANSETRON 4 MG PO TBDP: 4.0000 mg | ORAL_TABLET | Freq: Three times a day (TID) | ORAL | Status: DC | PRN

## 2014-07-03 NOTE — ED Provider Notes (Signed)
Medical screening examination/treatment/procedure(s) were performed by non-physician practitioner and as supervising physician I was immediately available for consultation/collaboration.   EKG Interpretation None        Marysol Wellnitz David Scherrie Seneca III, MD 07/03/14 1625 

## 2014-07-03 NOTE — ED Provider Notes (Signed)
CSN: [id number]     Arrival date & time 07/03/14  3086 History   First MD Initiated Contact with Patient 07/03/14 1204     Chief Complaint  Patient presents with  . Generalized Body Aches     (Consider location/radiation/quality/duration/timing/severity/associated sxs/prior Treatment) Patient is a 23 y.o. female presenting with vomiting. The history is provided by the patient. No language interpreter was used.  Emesis Severity:  Severe Duration:  3 days Timing:  Constant Number of daily episodes:  Multiple Able to tolerate:  Liquids Progression:  Worsening Chronicity:  New Recent urination:  Normal Relieved by:  Nothing Worsened by:  Nothing tried Ineffective treatments:  None tried Associated symptoms: no abdominal pain   Risk factors: no prior abdominal surgery, no sick contacts and no suspect food intake   Pt reports had muscle aches on Monday.  Pt reports she now aches all over and is having vomitting.   Past Medical History  Diagnosis Date  . Pneumonia   . Acid reflux   . Migraine    History reviewed. No pertinent past surgical history. No family history on file. History  Substance Use Topics  . Smoking status: Former Smoker    Quit date: 03/14/2013  . Smokeless tobacco: Never Used  . Alcohol Use: Yes     Comment: occassionally   OB History   Grav Para Term Preterm Abortions TAB SAB Ect Mult Living                 Review of Systems  Gastrointestinal: Positive for vomiting. Negative for abdominal pain.  All other systems reviewed and are negative.     Allergies  Zithromax and Penicillins  Home Medications   Prior to Admission medications   Medication Sig Start Date End Date Taking? Authorizing Provider  cyclobenzaprine (FLEXERIL) 10 MG tablet Take 1 tablet (10 mg total) by mouth 2 (two) times daily as needed for muscle spasms. 07/01/14   Hope Orlene Och, NP  naproxen (NAPROSYN) 500 MG tablet Take 1 tablet (500 mg total) by mouth 2 (two) times daily.  07/01/14   Hope Orlene Och, NP  ondansetron (ZOFRAN) 4 MG tablet Take 1 tablet (4 mg total) by mouth every 8 (eight) hours as needed for nausea or vomiting. 02/26/14   Charles B. Bernette Mayers, MD   BP 142/77  Pulse 55  Temp(Src) 99 F (37.2 C) (Oral)  Resp 20  SpO2 100%  LMP 05/19/2014 Physical Exam  Nursing note and vitals reviewed. Constitutional: She is oriented to person, place, and time. She appears well-developed and well-nourished.  HENT:  Head: Normocephalic and atraumatic.  Right Ear: External ear normal.  Left Ear: External ear normal.  Nose: Nose normal.  Mouth/Throat: Oropharynx is clear and moist.  Eyes: Conjunctivae and EOM are normal.  Neck: Normal range of motion.  Cardiovascular: Normal rate and normal heart sounds.   Pulmonary/Chest: Effort normal.  Abdominal: Soft. She exhibits no distension.  Musculoskeletal: Normal range of motion.  Neurological: She is alert and oriented to person, place, and time.  Skin: Skin is warm.  Psychiatric: She has a normal mood and affect.    ED Course  Procedures (including critical care time) Labs Review Labs Reviewed  CBC WITH DIFFERENTIAL  COMPREHENSIVE METABOLIC PANEL    Imaging Review Dg Chest 2 View  07/01/2014   CLINICAL DATA:  Pain radiating into arms  EXAM: CHEST  2 VIEW  COMPARISON:  December 26, 2012  FINDINGS: Lungs are clear. Heart size and pulmonary vascularity are  normal. No adenopathy. 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  IMPRESSION: No edema or consolidation.   Electronically Signed   By: Bretta Bang M.D.   On: 07/01/2014 18:26     EKG Interpretation None      MDM  Pt given Iv fluids and zofran.   Pt reports she feels better,   I suspect viral illness,  Negative upt from Monday,  No uti symptoms.  Labs normal   Final diagnoses:  Vomiting without nausea, vomiting of unspecified type    zofran Follow up with your MD for recheck on Friday as scheduled.    Lonia Skinner [place], PA-C 07/03/14 1624

## 2014-07-03 NOTE — Discharge Instructions (Signed)

## 2014-07-03 NOTE — ED Notes (Signed)
Patient here with ongoing nausea and vomiting, general body aches and reports chills. Seen on Monday for same and here for further evaluation. Had clean urine and negative xrays on monday

## 2014-07-18 ENCOUNTER — Encounter (HOSPITAL_BASED_OUTPATIENT_CLINIC_OR_DEPARTMENT_OTHER): Payer: Self-pay | Admitting: Emergency Medicine

## 2014-07-18 ENCOUNTER — Emergency Department (HOSPITAL_BASED_OUTPATIENT_CLINIC_OR_DEPARTMENT_OTHER)
Admission: EM | Admit: 2014-07-18 | Discharge: 2014-07-19 | Disposition: A | Discharge status: Home / Self Care | Payer: Self-pay | Attending: Emergency Medicine | Admitting: Emergency Medicine

## 2014-07-18 ENCOUNTER — Emergency Department (HOSPITAL_BASED_OUTPATIENT_CLINIC_OR_DEPARTMENT_OTHER): Payer: Self-pay

## 2014-07-18 DIAGNOSIS — Z880 Allergy status to penicillin: Secondary | ICD-10-CM | POA: Insufficient documentation

## 2014-07-18 DIAGNOSIS — Z87891 Personal history of nicotine dependence: Secondary | ICD-10-CM | POA: Insufficient documentation

## 2014-07-18 DIAGNOSIS — Z8719 Personal history of other diseases of the digestive system: Secondary | ICD-10-CM | POA: Insufficient documentation

## 2014-07-18 DIAGNOSIS — Z8701 Personal history of pneumonia (recurrent): Secondary | ICD-10-CM | POA: Insufficient documentation

## 2014-07-18 DIAGNOSIS — G43909 Migraine, unspecified, not intractable, without status migrainosus: Secondary | ICD-10-CM | POA: Insufficient documentation

## 2014-07-18 DIAGNOSIS — R091 Pleurisy: Secondary | ICD-10-CM

## 2014-07-18 DIAGNOSIS — Z3202 Encounter for pregnancy test, result negative: Secondary | ICD-10-CM | POA: Insufficient documentation

## 2014-07-18 DIAGNOSIS — J069 Acute upper respiratory infection, unspecified: Secondary | ICD-10-CM

## 2014-07-18 DIAGNOSIS — Z79899 Other long term (current) drug therapy: Secondary | ICD-10-CM | POA: Insufficient documentation

## 2014-07-18 LAB — PREGNANCY, URINE: Preg Test, Ur: NEGATIVE

## 2014-07-18 MED ORDER — PREDNISONE 50 MG PO TABS
60.0000 mg | ORAL_TABLET | Freq: Once | ORAL | Status: AC
  Administered 2014-07-18: 60 mg via ORAL
  Filled 2014-07-18 (×2): qty 1

## 2014-07-18 MED ORDER — HYDROCODONE-HOMATROPINE 5-1.5 MG/5ML PO SYRP: 5.0000 mL | ORAL_SOLUTION | Freq: Four times a day (QID) | ORAL | Status: DC | PRN

## 2014-07-18 MED ORDER — ALBUTEROL SULFATE HFA 108 (90 BASE) MCG/ACT IN AERS
2.0000 | INHALATION_SPRAY | RESPIRATORY_TRACT | Status: DC | PRN
  Administered 2014-07-18: 2 via RESPIRATORY_TRACT
  Filled 2014-07-18: qty 6.7

## 2014-07-18 MED ORDER — PREDNISONE 20 MG PO TABS: 40.0000 mg | ORAL_TABLET | Freq: Every day | ORAL | Status: DC

## 2014-07-18 NOTE — Discharge Instructions (Signed)
Pleurisy Pleurisy is an inflammation and swelling of the lining of the lungs (pleura). Because of this inflammation, it hurts to breathe. It can be aggravated by coughing, laughing, or deep breathing. Pleurisy is often caused by an underlying infection or disease.  HOME CARE INSTRUCTIONS  Monitor your pleurisy for any changes. The following actions may help to alleviate any discomfort you are experiencing:  Medicine may help with pain. Only take over-the-counter or prescription medicines for pain, discomfort, or fever as directed by your health care provider.  Only take antibiotic medicine as directed. Make sure to finish it even if you start to feel better. SEEK MEDICAL CARE IF:   Your pain is not controlled with medicine or is increasing.  You have an increase in pus-like (purulent) secretions brought up with coughing. SEEK IMMEDIATE MEDICAL CARE IF:   You have [place] or dark lips, fingernails, or toenails.  You are coughing up blood.  You have increased difficulty breathing.  You have continuing pain unrelieved by medicine or pain lasting more than 1 week.  You have pain that radiates into your neck, arms, or jaw.  You develop increased shortness of breath or wheezing.  You develop a fever, rash, vomiting, fainting, or other serious symptoms. MAKE SURE YOU:  Understand these instructions.   Will watch your condition.   Will get help right away if you are not doing well or get worse.  Document Released: 09/27/2005 Document Revised: 05/30/2013 Document Reviewed: 03/11/2013 [facility] Patient Information 2015 [place], [place]. This information is not intended to replace advice given to you by your health care provider. Make sure you discuss any questions you have with your health care provider.  Upper Respiratory Infection, Adult An upper respiratory infection (URI) is also sometimes known as the common cold. The upper respiratory tract includes the nose, sinuses, throat,  trachea, and bronchi. Bronchi are the airways leading to the lungs. Most people improve within 1 week, but symptoms can last up to 2 weeks. A residual cough may last even longer.  CAUSES Many different viruses can infect the tissues lining the upper respiratory tract. The tissues become irritated and inflamed and often become very moist. Mucus production is also common. A cold is contagious. You can easily spread the virus to others by oral contact. This includes kissing, sharing a glass, coughing, or sneezing. Touching your mouth or nose and then touching a surface, which is then touched by another person, can also spread the virus. SYMPTOMS  Symptoms typically develop 1 to 3 days after you come in contact with a cold virus. Symptoms vary from person to person. They may include:  Runny nose.  Sneezing.  Nasal congestion.  Sinus irritation.  Sore throat.  Loss of voice (laryngitis).  Cough.  Fatigue.  Muscle aches.  Loss of appetite.  Headache.  Low-grade fever. DIAGNOSIS  You might diagnose your own cold based on familiar symptoms, since most people get a cold 2 to 3 times a year. Your caregiver can confirm this based on your exam. Most importantly, your caregiver can check that your symptoms are not due to another disease such as strep throat, sinusitis, pneumonia, asthma, or epiglottitis. Blood tests, throat tests, and X-rays are not necessary to diagnose a common cold, but they may sometimes be helpful in excluding other more serious diseases. Your caregiver will decide if any further tests are required. RISKS AND COMPLICATIONS  You may be at risk for a more severe case of the common cold if you smoke cigarettes,  have chronic heart disease (such as heart failure) or lung disease (such as asthma), or if you have a weakened immune system. The very young and very old are also at risk for more serious infections. Bacterial sinusitis, middle ear infections, and bacterial pneumonia can  complicate the common cold. The common cold can worsen asthma and chronic obstructive pulmonary disease (COPD). Sometimes, these complications can require emergency medical care and may be life-threatening. PREVENTION  The best way to protect against getting a cold is to practice good hygiene. Avoid oral or hand contact with people with cold symptoms. Wash your hands often if contact occurs. There is no clear evidence that vitamin C, vitamin E, echinacea, or exercise reduces the chance of developing a cold. However, it is always recommended to get plenty of rest and practice good nutrition. TREATMENT  Treatment is directed at relieving symptoms. There is no cure. Antibiotics are not effective, because the infection is caused by a virus, not by bacteria. Treatment may include:  Increased fluid intake. Sports drinks offer valuable electrolytes, sugars, and fluids.  Breathing heated mist or steam (vaporizer or shower).  Eating chicken soup or other clear broths, and maintaining good nutrition.  Getting plenty of rest.  Using gargles or lozenges for comfort.  Controlling fevers with ibuprofen or acetaminophen as directed by your caregiver.  Increasing usage of your inhaler if you have asthma. Zinc gel and zinc lozenges, taken in the first 24 hours of the common cold, can shorten the duration and lessen the severity of symptoms. Pain medicines may help with fever, muscle aches, and throat pain. A variety of non-prescription medicines are available to treat congestion and runny nose. Your caregiver can make recommendations and may suggest nasal or lung inhalers for other symptoms.  HOME CARE INSTRUCTIONS   Only take over-the-counter or prescription medicines for pain, discomfort, or fever as directed by your caregiver.  Use a warm mist humidifier or inhale steam from a shower to increase air moisture. This may keep secretions moist and make it easier to breathe.  Drink enough water and fluids to  keep your urine clear or pale yellow.  Rest as needed.  Return to work when your temperature has returned to normal or as your caregiver advises. You may need to stay home longer to avoid infecting others. You can also use a face mask and careful hand washing to prevent spread of the virus. SEEK MEDICAL CARE IF:   After the first few days, you feel you are getting worse rather than better.  You need your caregiver's advice about medicines to control symptoms.  You develop chills, worsening shortness of breath, or brown or red sputum. These may be signs of pneumonia.  You develop yellow or brown nasal discharge or pain in the face, especially when you bend forward. These may be signs of sinusitis.  You develop a fever, swollen neck glands, pain with swallowing, or white areas in the back of your throat. These may be signs of strep throat. SEEK IMMEDIATE MEDICAL CARE IF:   You have a fever.  You develop severe or persistent headache, ear pain, sinus pain, or chest pain.  You develop wheezing, a prolonged cough, cough up blood, or have a change in your usual mucus (if you have chronic lung disease).  You develop sore muscles or a stiff neck. Document Released: 03/23/2001 Document Revised: 12/20/2011 Document Reviewed: 01/02/2014 [facility] Patient Information 2015 [place], [place]. This information is not intended to replace advice given to you by  your health care provider. Make sure you discuss any questions you have with your health care provider. ° °

## 2014-07-18 NOTE — ED Notes (Signed)
Cough, sore throat, difficulty breathing and chest tightness when she talks since yesterday.

## 2014-07-18 NOTE — ED Provider Notes (Signed)
CSN: [id number]     Arrival date & time 07/18/14  2051 History  This chart was scribed for Jamie Archer, * by Jarvis Morganaylor Ferguson, ED Scribe. This patient was seen in room MH09/MH09 and the patient's care was started at 10:30 PM.    Chief Complaint  Patient presents with  . URI    The history is provided by the patient. No language interpreter was used.   HPI Comments: Jamie Archer is a 23 y.o. female who presents to the Emergency Department complaining of gradually worsening URI symptoms for 2 days. She is having associated moderate productive cough, sore throat, shortness of breath, and chest tightness. She states that the difficulty breathing is exacerbated when she tries to talk or take a deep breath. She denies any congestion, fever, chills, nausea, vomiting or diarrhea.    Past Medical History  Diagnosis Date  . Pneumonia   . Acid reflux   . Migraine    History reviewed. No pertinent past surgical history. No family history on file. History  Substance Use Topics  . Smoking status: Former Smoker    Quit date: 03/14/2013  . Smokeless tobacco: Never Used  . Alcohol Use: Yes     Comment: occassionally   OB History   Grav Para Term Preterm Abortions TAB SAB Ect Mult Living                 Review of Systems  Constitutional: Negative for fever and chills.  HENT: Positive for rhinorrhea and sore throat. Negative for congestion.   Respiratory: Positive for cough, chest tightness and shortness of breath.   Gastrointestinal: Negative for nausea, vomiting and diarrhea.  All other systems reviewed and are negative.     Allergies  Zithromax and Penicillins  Home Medications   Prior to Admission medications   Medication Sig Start Date End Date Taking? Authorizing Provider  cyclobenzaprine (FLEXERIL) 10 MG tablet Take 1 tablet (10 mg total) by mouth 2 (two) times daily as needed for muscle spasms. 07/01/14   Hope Orlene OchM Neese, NP  naproxen (NAPROSYN) 500 MG tablet Take 1  tablet (500 mg total) by mouth 2 (two) times daily. 07/01/14   Hope Orlene OchM Neese, NP  ondansetron (ZOFRAN ODT) 4 MG disintegrating tablet Take 1 tablet (4 mg total) by mouth every 8 (eight) hours as needed for nausea or vomiting. 07/03/14   Elson AreasLeslie K Sofia, PA-C  ondansetron (ZOFRAN) 4 MG tablet Take 1 tablet (4 mg total) by mouth every 8 (eight) hours as needed for nausea or vomiting. 02/26/14   Charles B. Bernette MayersSheldon, MD   Triage Vitals: BP 172/83  Pulse 98  Temp(Src) 98.7 F (37.1 C) (Oral)  Resp 18  Ht 5\' 5"  (1.651 m)  Wt 275 lb (124.739 kg)  BMI 45.76 kg/m2  SpO2 94%  LMP 05/19/2014  Physical Exam  Constitutional: She is oriented to person, place, and time. She appears well-developed and well-nourished. No distress.  HENT:  Head: Normocephalic and atraumatic.  Right Ear: Hearing normal.  Left Ear: Hearing normal.  Nose: Nose normal.  Mouth/Throat: Oropharynx is clear and moist and mucous membranes are normal.  Eyes: Conjunctivae and EOM are normal. Pupils are equal, round, and reactive to light.  Neck: Normal range of motion. Neck supple.  Cardiovascular: Regular rhythm, S1 normal and S2 normal.  Exam reveals no gallop and no friction rub.   No murmur heard. Pulmonary/Chest: Effort normal and breath sounds normal. No respiratory distress. She exhibits no tenderness.  Abdominal: Soft. Normal  appearance and bowel sounds are normal. There is no hepatosplenomegaly. There is no tenderness. There is no rebound, no guarding, no tenderness at McBurney's point and negative Murphy's sign. No hernia.  Musculoskeletal: Normal range of motion.  Neurological: She is alert and oriented to person, place, and time. She has normal strength. No cranial nerve deficit or sensory deficit. Coordination normal. GCS eye subscore is 4. GCS verbal subscore is 5. GCS motor subscore is 6.  Skin: Skin is warm, dry and intact. No rash noted. No cyanosis.  Psychiatric: She has a normal mood and affect. Her speech is normal  and behavior is normal. Thought content normal.    ED Course  Procedures (including critical care time)  DIAGNOSTIC STUDIES: Oxygen Saturation is 94% on RA, adequate by my interpretation.    COORDINATION OF CARE: 10:39 PM- Will order 12 lead EKG and CXR.  Pt advised of plan for treatment and pt agrees.   Labs Review Labs Reviewed - No data to display  Imaging Review No results found.   EKG Interpretation None      MDM   Final diagnoses:  None  URI Pleurisy  She presents to the ER for evaluation of cough and chest discomfort. Symptoms started yesterday. Patient reports that she has had a severe cough that has been uncontrollable. She is now experiencing pain in her ribs. Initially the pain was just when she coughed, now it is on time. This is consistent with an upper respiratory infection with possible pleurisy. She might simply have muscular tenderness secondary to the coughing. EKG was unremarkable. Chest x-ray was unremarkable. Patient treated symptomatically with Hycodan, also albuterol and prednisone.  I personally performed the services described in this documentation, which was scribed in my presence. The recorded information has been reviewed and is accurate.      Jamie Crease, MD 07/18/14 [phone]

## 2014-08-30 ENCOUNTER — Emergency Department (HOSPITAL_BASED_OUTPATIENT_CLINIC_OR_DEPARTMENT_OTHER)
Admission: EM | Admit: 2014-08-30 | Discharge: 2014-08-30 | Disposition: A | Discharge status: Home / Self Care | Payer: Self-pay | Attending: Emergency Medicine | Admitting: Emergency Medicine

## 2014-08-30 ENCOUNTER — Encounter (HOSPITAL_BASED_OUTPATIENT_CLINIC_OR_DEPARTMENT_OTHER): Payer: Self-pay | Admitting: Emergency Medicine

## 2014-08-30 DIAGNOSIS — Z87891 Personal history of nicotine dependence: Secondary | ICD-10-CM | POA: Insufficient documentation

## 2014-08-30 DIAGNOSIS — G43909 Migraine, unspecified, not intractable, without status migrainosus: Secondary | ICD-10-CM | POA: Insufficient documentation

## 2014-08-30 DIAGNOSIS — Z8701 Personal history of pneumonia (recurrent): Secondary | ICD-10-CM | POA: Insufficient documentation

## 2014-08-30 DIAGNOSIS — Z8719 Personal history of other diseases of the digestive system: Secondary | ICD-10-CM | POA: Insufficient documentation

## 2014-08-30 DIAGNOSIS — Z3202 Encounter for pregnancy test, result negative: Secondary | ICD-10-CM | POA: Insufficient documentation

## 2014-08-30 LAB — URINALYSIS, ROUTINE W REFLEX MICROSCOPIC
Bilirubin Urine: NEGATIVE
GLUCOSE, UA: NEGATIVE mg/dL
Hgb urine dipstick: NEGATIVE
Ketones, ur: NEGATIVE mg/dL
Leukocytes, UA: NEGATIVE
Nitrite: NEGATIVE
Protein, ur: NEGATIVE mg/dL
SPECIFIC GRAVITY, URINE: 1.016 (ref 1.005–1.030)
Urobilinogen, UA: 0.2 mg/dL (ref 0.0–1.0)
pH: 7 (ref 5.0–8.0)

## 2014-08-30 LAB — PREGNANCY, URINE: PREG TEST UR: NEGATIVE

## 2014-08-30 MED ORDER — BUTALBITAL-APAP-CAFFEINE 50-325-40 MG PO TABS: 1.0000 | ORAL_TABLET | Freq: Four times a day (QID) | ORAL | Status: AC | PRN

## 2014-08-30 MED ORDER — KETOROLAC TROMETHAMINE 60 MG/2ML IM SOLN
60.0000 mg | Freq: Once | INTRAMUSCULAR | Status: AC
  Administered 2014-08-30: 60 mg via INTRAMUSCULAR
  Filled 2014-08-30: qty 2

## 2014-08-30 NOTE — Discharge Instructions (Signed)

## 2014-08-30 NOTE — ED Notes (Signed)
Migraine x 2 weeks    hjx of same

## 2014-08-30 NOTE — ED Notes (Signed)
Pt. Reports she has had a headache for approx. 2 wks now.  Last period was in August 2015

## 2014-08-30 NOTE — ED Provider Notes (Signed)
CSN: [id number]     Arrival date & time 08/30/14  1532 History   First MD Initiated Contact with Patient 08/30/14 1743     Chief Complaint  Patient presents with  . Migraine     (Consider location/radiation/quality/duration/timing/severity/associated sxs/prior Treatment) HPI   23 year old female presents complaining of headache. Patient admits that she has history of migraine headache and she is here for her usual migraine headache. She described headaches as a throbbing sharp sensation that affects her forehead and behind eyes and change in location periodically, similar to a cluster headache. Headache is waxing waning but never fully resolved. Headache improves when she sleeps. She has tried over-the-counter medications such as Tylenol ibuprofen or Aleve with minimal relief. She has been seen and evaluated by neurologists in the past and was diagnosed with having migraine but states she did not receive any specific type of treatment. At this time she denies any fever, chills, runny nose, sneezing, coughing, chest pain, shortness of breath, abdominal pain, nausea, vomiting, diarrhea, dysuria, or rash. Her last menstrual period was in August. She is a G0 P0. She has no other complaint.  Past Medical History  Diagnosis Date  . Pneumonia   . Acid reflux   . Migraine    History reviewed. No pertinent past surgical history. No family history on file. History  Substance Use Topics  . Smoking status: Former Smoker    Quit date: 03/14/2013  . Smokeless tobacco: Never Used  . Alcohol Use: Yes     Comment: occassionally   OB History    No data available     Review of Systems  All other systems reviewed and are negative.     Allergies  Zithromax and Penicillins  Home Medications   Prior to Admission medications   Not on File   BP 133/79 mmHg  Pulse 60  Temp(Src) 98.9 F (37.2 C) (Oral)  Resp 18  Ht 5\' 4"  (1.626 m)  Wt 275 lb (124.739 kg)  BMI 47.18 kg/m2  SpO2 100%   LMP 05/30/2014 Physical Exam  Constitutional: She is oriented to person, place, and time. She appears well-developed and well-nourished. No distress.  HENT:  Head: Atraumatic.  Right Ear: External ear normal.  Left Ear: External ear normal.  Eyes: Conjunctivae and EOM are normal. Pupils are equal, round, and reactive to light.  Neck: Normal range of motion. Neck supple.  No nuchal rigidity  Neurological: She is alert and oriented to person, place, and time.  Neurologic exam:  Speech clear, pupils equal round reactive to light, extraocular movements intact  Normal peripheral visual fields Cranial nerves III through XII normal including no facial droop Follows commands, moves all extremities x4, normal strength to bilateral upper and lower extremities at all major muscle groups including grip Sensation normal to light touch  Coordination intact, no limb ataxia, finger-nose-finger normal Rapid alternating movements normal No pronator drift Gait normal   Skin: No rash noted.  Psychiatric: She has a normal mood and affect.  Nursing note and vitals reviewed.   ED Course  Procedures (including critical care time)  6:06 PM Headache similar to previous, no fever, neck stiffness, neuro findings or new symptoms to suggest more serious etiology.  I don't think SAH, ICH, meningitis, encephalitis, mass at this time.  No recent trauma.  I don't feel imaging necessary at this time.  Plan to control symptoms.  6:06 PM Pt declined migraine cocktail and opted for IM toradol.  Pt also agrees to f/u with  neurologist for further management of her recurrent headache.  Return precaution discussed.     Labs Review Labs Reviewed  URINALYSIS, ROUTINE W REFLEX MICROSCOPIC  PREGNANCY, URINE    Imaging Review No results found.   EKG Interpretation None      MDM   Final diagnoses:  Migraine without status migrainosus, not intractable, unspecified migraine type    BP 133/79 mmHg  Pulse 60   Temp(Src) 98.9 F (37.2 C) (Oral)  Resp 18  Ht 5\' 4"  (1.626 m)  Wt 275 lb (124.739 kg)  BMI 47.18 kg/m2  SpO2 100%  LMP 05/30/2014     Fayrene HelperBowie Aveena Bari, PA-C 08/30/14 1833  Hurman HornJohn M Bednar, MD 09/05/14 [phone]

## 2014-10-18 ENCOUNTER — Emergency Department (HOSPITAL_BASED_OUTPATIENT_CLINIC_OR_DEPARTMENT_OTHER): Payer: Self-pay

## 2014-10-18 ENCOUNTER — Emergency Department (HOSPITAL_BASED_OUTPATIENT_CLINIC_OR_DEPARTMENT_OTHER)
Admission: EM | Admit: 2014-10-18 | Discharge: 2014-10-18 | Disposition: A | Discharge status: Home / Self Care | Payer: Self-pay | Attending: Emergency Medicine | Admitting: Emergency Medicine

## 2014-10-18 ENCOUNTER — Encounter (HOSPITAL_BASED_OUTPATIENT_CLINIC_OR_DEPARTMENT_OTHER): Payer: Self-pay | Admitting: *Deleted

## 2014-10-18 DIAGNOSIS — Z79899 Other long term (current) drug therapy: Secondary | ICD-10-CM | POA: Insufficient documentation

## 2014-10-18 DIAGNOSIS — R079 Chest pain, unspecified: Secondary | ICD-10-CM

## 2014-10-18 DIAGNOSIS — R0602 Shortness of breath: Secondary | ICD-10-CM | POA: Insufficient documentation

## 2014-10-18 DIAGNOSIS — Z8719 Personal history of other diseases of the digestive system: Secondary | ICD-10-CM | POA: Insufficient documentation

## 2014-10-18 DIAGNOSIS — Z880 Allergy status to penicillin: Secondary | ICD-10-CM | POA: Insufficient documentation

## 2014-10-18 DIAGNOSIS — Z87891 Personal history of nicotine dependence: Secondary | ICD-10-CM | POA: Insufficient documentation

## 2014-10-18 DIAGNOSIS — Z792 Long term (current) use of antibiotics: Secondary | ICD-10-CM | POA: Insufficient documentation

## 2014-10-18 DIAGNOSIS — Z8701 Personal history of pneumonia (recurrent): Secondary | ICD-10-CM | POA: Insufficient documentation

## 2014-10-18 DIAGNOSIS — Z8679 Personal history of other diseases of the circulatory system: Secondary | ICD-10-CM | POA: Insufficient documentation

## 2014-10-18 DIAGNOSIS — R05 Cough: Secondary | ICD-10-CM | POA: Insufficient documentation

## 2014-10-18 DIAGNOSIS — R0789 Other chest pain: Secondary | ICD-10-CM | POA: Insufficient documentation

## 2014-10-18 MED ORDER — TRAMADOL HCL 50 MG PO TABS
50.0000 mg | ORAL_TABLET | Freq: Four times a day (QID) | ORAL | Status: DC | PRN
Start: 2014-10-18 — End: 2018-09-20

## 2014-10-18 MED ORDER — TRAMADOL HCL 50 MG PO TABS
50.0000 mg | ORAL_TABLET | Freq: Once | ORAL | Status: AC
  Administered 2014-10-18: 50 mg via ORAL
  Filled 2014-10-18: qty 1

## 2014-10-18 NOTE — ED Provider Notes (Signed)
CSN: [id number]     Arrival date & time 10/18/14  2042 History  This chart was scribed for Geoffery Lyons, MD by Roxy Cedar, ED Scribe. This patient was seen in room MH02/MH02 and the patient's care was started at 11:15 PM.   Chief Complaint  Patient presents with  . Chest Pain   Patient is a 24 y.o. female presenting with chest pain. The history is provided by the patient. No language interpreter was used.  Chest Pain Pain location:  L chest and R chest Pain quality: aching and sharp   Pain radiates to:  Upper back and precordial region Pain radiates to the back: yes   Pain severity:  Moderate Onset quality:  Gradual Timing:  Intermittent Progression:  Waxing and waning Chronicity:  New Context: breathing   Relieved by:  Nothing Worsened by:  Nothing tried  HPI Comments: [name] is a 24 y.o. female who presents to the Emergency Department complaining of moderate chest pain which radiates to upper back and thoracic area that began earlier today. Patient states that she was laying down and watching TV during onset of symptoms. Patient reports associated productive cough and shortness of breath. She states that chest pain persists longer when she coughs. She states that pain worsens with inhalation. She denies prior history of similar symptoms in the past.  Past Medical History  Diagnosis Date  . Pneumonia   . Acid reflux   . Migraine    History reviewed. No pertinent past surgical history. No family history on file. History  Substance Use Topics  . Smoking status: Former Smoker    Quit date: 03/14/2013  . Smokeless tobacco: Never Used  . Alcohol Use: Yes     Comment: occassionally   OB History    No data available     Review of Systems  Cardiovascular: Positive for chest pain.   A complete 10 system review of systems was obtained and all systems are negative except as noted in the HPI and PMH.    Allergies  Zithromax and Penicillins  Home Medications    Prior to Admission medications   Medication Sig Start Date End Date Taking? Authorizing Provider  clindamycin (CLEOCIN) 300 MG capsule Take 300 mg by mouth 3 (three) times daily.   Yes Historical Provider, MD  butalbital-acetaminophen-caffeine (FIORICET) 50-325-40 MG per tablet Take 1-2 tablets by mouth every 6 (six) hours as needed for headache. 08/30/14 08/30/15  Fayrene Helper, PA-C   Triage Vitals: BP 148/99 mmHg  Pulse 96  Temp(Src) 98.3 F (36.8 C) (Oral)  Resp 20  Ht  (1.651 m)  Wt 275 lb (124.739 kg)  BMI 45.76 kg/m2  SpO2 99%  LMP 09/29/2014  Physical Exam  Constitutional: She is oriented to person, place, and time. She appears well-developed and well-nourished.  HENT:  Head: Normocephalic and atraumatic.  Eyes: Pupils are equal, round, and reactive to light. Right eye exhibits no discharge. Left eye exhibits no discharge.  Neck: Normal range of motion.  Cardiovascular: Normal rate and regular rhythm.   Pulmonary/Chest: Effort normal and breath sounds normal. No respiratory distress. She exhibits tenderness.  There is tenderness to the anterior chest and posterior chest wall. This reproduces her symptoms.  Abdominal: Soft. Bowel sounds are normal. There is no tenderness.  Musculoskeletal: Normal range of motion. She exhibits no edema or tenderness.  There is no calf tenderness, swelling, and Homans sign is absent bilaterally.  Lymphadenopathy:    She has no cervical adenopathy.  Neurological:  She is alert and oriented to person, place, and time. She has normal reflexes. No cranial nerve deficit. She exhibits normal muscle tone. Coordination normal.  Skin: Skin is warm and dry.  Psychiatric: She has a normal mood and affect. Her behavior is normal.  Nursing note and vitals reviewed.  ED Course  Procedures (including critical care time)  DIAGNOSTIC STUDIES: Oxygen Saturation is 99% on RA, normal by my interpretation.    COORDINATION OF CARE: 11:19 PM- Discussed  plans to order diagnostic CXR and EKG. Pt advised of plan for treatment and pt agrees.  Labs Review Labs Reviewed - No data to display  Imaging Review Dg Chest 2 View  10/18/2014   CLINICAL DATA:  24 year old female with mid substernal chest pain, cough and congestion.  EXAM: CHEST  2 VIEW  COMPARISON:  Chest x-ray 07/18/2014.  FINDINGS: Lung volumes are normal. No consolidative airspace disease. No pleural effusions. No pneumothorax. No pulmonary nodule or mass noted. Pulmonary vasculature and the cardiomediastinal silhouette are within normal limits.  IMPRESSION: No radiographic evidence of acute cardiopulmonary disease.   Electronically Signed   By: Trudie Reedaniel  Entrikin M.D.   On: 10/18/2014 21:17    EKG Interpretation   Date/Time:  Friday October 18 2014 20:49:22 EST Ventricular Rate:  57 PR Interval:  150 QRS Duration: 90 QT Interval:  396 QTC Calculation: 385 R Axis:   68 Text Interpretation:  Sinus bradycardia with sinus arrhythmia Otherwise  normal ECG Confirmed by DELOS  MD, Kehinde Totzke ([id number]) on 10/18/2014 11:12:10 PM     MDM   Final diagnoses:  Chest pain    Patient is a 24 year old obese female who presents with complaints of sharp pains in the front and back of her chest that started approximately 3 hours prior to arrival. Her pain is sharp in nature and easily reproducible with palpation of her chest wall. Her EKG is normal and chest x-ray reveals no acute process. There is no tachycardia or hypoxia that would suggest pulmonary embolism. Her symptoms appear most consistent with a musculoskeletal etiology and I feel as though she is appropriate for discharge. She will be given tramadol to take as needed for pain and advised to return if her symptoms worsen or change.  I personally performed the services described in this documentation, which was scribed in my presence. The recorded information has been reviewed and is accurate.  Geoffery Lyonsouglas Abegail Kloeppel, MD 10/19/14 [phone]

## 2014-10-18 NOTE — ED Notes (Signed)
Pt reports gradual onset of sharp, central CP with SOB, nausea and dizziness apprx. 3 hrs PTA.

## 2014-10-18 NOTE — Discharge Instructions (Signed)
Ibuprofen 600 mg 3 times daily for the next 5 days  Tramadol as prescribed as needed for pain not relieved with ibuprofen.  Return to the emergency department if he develops severe pain, difficulty breathing, or other new and concerning symptoms.   Chest Wall Pain Chest wall pain is pain in or around the bones and muscles of your chest. It may take up to 6 weeks to get better. It may take longer if you must stay physically active in your work and activities.  CAUSES  Chest wall pain may happen on its own. However, it may be caused by:  A viral illness like the flu.  Injury.  Coughing.  Exercise.  Arthritis.  Fibromyalgia.  Shingles. HOME CARE INSTRUCTIONS   Avoid overtiring physical activity. Try not to strain or perform activities that cause pain. This includes any activities using your chest or your abdominal and side muscles, especially if heavy weights are used.  Put ice on the sore area.  Put ice in a plastic bag.  Place a towel between your skin and the bag.  Leave the ice on for 15-20 minutes per hour while awake for the first 2 days.  Only take over-the-counter or prescription medicines for pain, discomfort, or fever as directed by your caregiver. SEEK IMMEDIATE MEDICAL CARE IF:   Your pain increases, or you are very uncomfortable.  You have a fever.  Your chest pain becomes worse.  You have new, unexplained symptoms.  You have nausea or vomiting.  You feel sweaty or lightheaded.  You have a cough with phlegm (sputum), or you cough up blood. MAKE SURE YOU:   Understand these instructions.  Will watch your condition.  Will get help right away if you are not doing well or get worse. Document Released: 09/27/2005 Document Revised: 12/20/2011 Document Reviewed: 05/24/2011 [facility] Patient Information 2015 [place], [place]. This information is not intended to replace advice given to you by your health care provider. Make sure you discuss any questions  you have with your health care provider.

## 2014-12-31 ENCOUNTER — Emergency Department (HOSPITAL_BASED_OUTPATIENT_CLINIC_OR_DEPARTMENT_OTHER)
Admission: EM | Admit: 2014-12-31 | Discharge: 2014-12-31 | Disposition: A | Discharge status: Home / Self Care | Payer: Self-pay | Attending: Emergency Medicine | Admitting: Emergency Medicine

## 2014-12-31 ENCOUNTER — Other Ambulatory Visit: Payer: Self-pay

## 2014-12-31 ENCOUNTER — Encounter (HOSPITAL_BASED_OUTPATIENT_CLINIC_OR_DEPARTMENT_OTHER): Payer: Self-pay | Admitting: *Deleted

## 2014-12-31 ENCOUNTER — Emergency Department (HOSPITAL_BASED_OUTPATIENT_CLINIC_OR_DEPARTMENT_OTHER): Payer: Self-pay

## 2014-12-31 DIAGNOSIS — Z8701 Personal history of pneumonia (recurrent): Secondary | ICD-10-CM | POA: Insufficient documentation

## 2014-12-31 DIAGNOSIS — Z3202 Encounter for pregnancy test, result negative: Secondary | ICD-10-CM | POA: Insufficient documentation

## 2014-12-31 DIAGNOSIS — Z87891 Personal history of nicotine dependence: Secondary | ICD-10-CM | POA: Insufficient documentation

## 2014-12-31 DIAGNOSIS — Z8679 Personal history of other diseases of the circulatory system: Secondary | ICD-10-CM | POA: Insufficient documentation

## 2014-12-31 DIAGNOSIS — Z880 Allergy status to penicillin: Secondary | ICD-10-CM | POA: Insufficient documentation

## 2014-12-31 DIAGNOSIS — Z792 Long term (current) use of antibiotics: Secondary | ICD-10-CM | POA: Insufficient documentation

## 2014-12-31 DIAGNOSIS — Z8719 Personal history of other diseases of the digestive system: Secondary | ICD-10-CM | POA: Insufficient documentation

## 2014-12-31 DIAGNOSIS — R519 Headache, unspecified: Secondary | ICD-10-CM

## 2014-12-31 DIAGNOSIS — R51 Headache: Secondary | ICD-10-CM | POA: Insufficient documentation

## 2014-12-31 DIAGNOSIS — M545 Low back pain: Secondary | ICD-10-CM

## 2014-12-31 DIAGNOSIS — M5440 Lumbago with sciatica, unspecified side: Secondary | ICD-10-CM | POA: Insufficient documentation

## 2014-12-31 LAB — CBC WITH DIFFERENTIAL/PLATELET
BASOS PCT: 0 % (ref 0–1)
Basophils Absolute: 0 10*3/uL (ref 0.0–0.1)
EOS ABS: 0.1 10*3/uL (ref 0.0–0.7)
Eosinophils Relative: 1 % (ref 0–5)
HCT: 38.6 % (ref 36.0–46.0)
Hemoglobin: 12.6 g/dL (ref 12.0–15.0)
Lymphocytes Relative: 25 % (ref 12–46)
Lymphs Abs: 1.8 10*3/uL (ref 0.7–4.0)
MCH: 30 pg (ref 26.0–34.0)
MCHC: 32.6 g/dL (ref 30.0–36.0)
MCV: 91.9 fL (ref 78.0–100.0)
MONO ABS: 0.4 10*3/uL (ref 0.1–1.0)
MONOS PCT: 5 % (ref 3–12)
Neutro Abs: 4.9 10*3/uL (ref 1.7–7.7)
Neutrophils Relative %: 69 % (ref 43–77)
Platelets: 262 10*3/uL (ref 150–400)
RBC: 4.2 MIL/uL (ref 3.87–5.11)
RDW: 12.7 % (ref 11.5–15.5)
WBC: 7.2 10*3/uL (ref 4.0–10.5)

## 2014-12-31 LAB — COMPREHENSIVE METABOLIC PANEL
ALT: 18 U/L (ref 0–35)
AST: 17 U/L (ref 0–37)
Albumin: 4.3 g/dL (ref 3.5–5.2)
Alkaline Phosphatase: 61 U/L (ref 39–117)
Anion gap: 8 (ref 5–15)
BUN: 8 mg/dL (ref 6–23)
CALCIUM: 9.1 mg/dL (ref 8.4–10.5)
CO2: 25 mmol/L (ref 19–32)
CREATININE: 0.61 mg/dL (ref 0.50–1.10)
Chloride: 104 mmol/L (ref 96–112)
GFR calc Af Amer: >90 mL/min (ref >90)
GLUCOSE: 95 mg/dL (ref 70–99)
Potassium: 3.9 mmol/L (ref 3.5–5.1)
Sodium: 137 mmol/L (ref 135–145)
Total Bilirubin: 0.5 mg/dL (ref 0.3–1.2)
Total Protein: 7.6 g/dL (ref 6.0–8.3)

## 2014-12-31 LAB — URINALYSIS, ROUTINE W REFLEX MICROSCOPIC
BILIRUBIN URINE: NEGATIVE
GLUCOSE, UA: NEGATIVE mg/dL
Hgb urine dipstick: NEGATIVE
KETONES UR: NEGATIVE mg/dL
Nitrite: NEGATIVE
PH: 7 (ref 5.0–8.0)
PROTEIN: NEGATIVE mg/dL
Specific Gravity, Urine: 1.021 (ref 1.005–1.030)
Urobilinogen, UA: 0.2 mg/dL (ref 0.0–1.0)

## 2014-12-31 LAB — RAPID URINE DRUG SCREEN, HOSP PERFORMED
Amphetamines: NOT DETECTED
BENZODIAZEPINES: NOT DETECTED
Barbiturates: NOT DETECTED
Cocaine: NOT DETECTED
Opiates: NOT DETECTED
Tetrahydrocannabinol: POSITIVE — AB

## 2014-12-31 LAB — URINE MICROSCOPIC-ADD ON

## 2014-12-31 LAB — PREGNANCY, URINE: PREG TEST UR: NEGATIVE

## 2014-12-31 MED ORDER — SODIUM CHLORIDE 0.9 % IV BOLUS (SEPSIS)
1000.0000 mL | Freq: Once | INTRAVENOUS | Status: AC
  Administered 2014-12-31: 1000 mL via INTRAVENOUS

## 2014-12-31 MED ORDER — DIPHENHYDRAMINE HCL 50 MG/ML IJ SOLN
25.0000 mg | Freq: Once | INTRAMUSCULAR | Status: AC
  Administered 2014-12-31: 25 mg via INTRAVENOUS
  Filled 2014-12-31: qty 1

## 2014-12-31 MED ORDER — METOCLOPRAMIDE HCL 5 MG/ML IJ SOLN
10.0000 mg | Freq: Once | INTRAMUSCULAR | Status: AC
  Administered 2014-12-31: 10 mg via INTRAVENOUS
  Filled 2014-12-31: qty 2

## 2014-12-31 MED ORDER — KETOROLAC TROMETHAMINE 30 MG/ML IJ SOLN
30.0000 mg | Freq: Once | INTRAMUSCULAR | Status: AC
  Administered 2014-12-31: 30 mg via INTRAVENOUS
  Filled 2014-12-31: qty 1

## 2014-12-31 NOTE — ED Provider Notes (Signed)
CSN: [id number]     Arrival date & time 12/31/14  1022 History  This chart was scribed for Glynn Octave, MD by Leone Payor, ED Scribe. This patient was seen in room MH12/MH12 and the patient's care was started 11:34 AM.     Chief Complaint  Patient presents with  . Back Pain   The history is provided by the patient. No language interpreter was used.     HPI Comments: Bernadene Garside is a 24 y.o. female who presents to the Emergency Department complaining of constant, gradually worsened, non-radiating lower back pain that began yesterday. She denies recent falls or injuries. She denies weakness or numbness to lower extremities, change in bowel or bladder function, blurry or double vision.   She also complains of a gradual onset, constant, gradually worsened HA that began today. She reports the HA initially was located to the back of the head and progressed to the front. She had associated 3 episodes of vomiting today. Significant other states he believes she had a seizure this morning about 5 hours ago. He states patient was shaking with drooling and tongue biting. He states it took 5-7 minutes to wake her and about 20 minutes before she would converse with him. She also reports mild abdominal pain. LMP Feb 25   Past Medical History  Diagnosis Date  . Pneumonia   . Acid reflux   . Migraine    History reviewed. No pertinent past surgical history. History reviewed. No pertinent family history. History  Substance Use Topics  . Smoking status: Former Smoker    Quit date: 03/14/2013  . Smokeless tobacco: Never Used  . Alcohol Use: Yes     Comment: occassionally   OB History    No data available     Review of Systems  A complete 10 system review of systems was obtained and all systems are negative except as noted in the HPI and PMH.    Allergies  Zithromax and Penicillins  Home Medications   Prior to Admission medications   Medication Sig Start Date End Date Taking? Authorizing  Provider  butalbital-acetaminophen-caffeine (FIORICET) 50-325-40 MG per tablet Take 1-2 tablets by mouth every 6 (six) hours as needed for headache. 08/30/14 08/30/15  Fayrene Helper, PA-C  clindamycin (CLEOCIN) 300 MG capsule Take 300 mg by mouth 3 (three) times daily.    Historical Provider, MD  traMADol (ULTRAM) 50 MG tablet Take 1 tablet (50 mg total) by mouth every 6 (six) hours as needed. 10/18/14   Geoffery Lyons, MD   BP 129/75 mmHg  Pulse 58  Temp(Src) 98.3 F (36.8 C) (Oral)  Resp 18  Ht  (1.651 m)  Wt 275 lb (124.739 kg)  BMI 45.76 kg/m2  SpO2 100%  LMP 12/05/2014 Physical Exam  Constitutional: She is oriented to person, place, and time. She appears well-developed and well-nourished. No distress.  HENT:  Head: Normocephalic and atraumatic.  Mouth/Throat: Oropharynx is clear and moist. No oropharyngeal exudate.  Eyes: Conjunctivae and EOM are normal. Pupils are equal, round, and reactive to light.  Neck: Normal range of motion. Neck supple.  No meningismus.  Cardiovascular: Normal rate, regular rhythm, normal heart sounds and intact distal pulses.   No murmur heard. Pulmonary/Chest: Effort normal and breath sounds normal. No respiratory distress.  Abdominal: Soft. There is no tenderness. There is no rebound and no guarding.  Genitourinary:  No CVA tenderness   Musculoskeletal: Normal range of motion. She exhibits tenderness. She exhibits no edema.  Midline lumbar  tenderness    Neurological: She is alert and oriented to person, place, and time. No cranial nerve deficit. She exhibits normal muscle tone. Coordination normal.  No ataxia on finger to nose bilaterally. No pronator drift. 5/5 strength throughout. CN 2-12 intact. Negative Romberg. Equal grip strength. Sensation intact. Gait is normal.   Skin: Skin is warm.  Psychiatric: She has a normal mood and affect. Her behavior is normal.  Nursing note and vitals reviewed.   ED Course  Procedures (including critical care  time)  DIAGNOSTIC STUDIES: Oxygen Saturation is 100% on RA, normal by my interpretation.    COORDINATION OF CARE: 11:43 AM Discussed treatment plan with pt at bedside and pt agreed to plan.   Labs Review Labs Reviewed  URINALYSIS, ROUTINE W REFLEX MICROSCOPIC - Abnormal; Notable for the following:    APPearance CLOUDY (*)    Leukocytes, UA MODERATE (*)    All other components within normal limits  URINE MICROSCOPIC-ADD ON - Abnormal; Notable for the following:    Squamous Epithelial / LPF MANY (*)    Bacteria, UA MANY (*)    All other components within normal limits  URINE RAPID DRUG SCREEN (HOSP PERFORMED) - Abnormal; Notable for the following:    Tetrahydrocannabinol POSITIVE (*)    All other components within normal limits  PREGNANCY, URINE  CBC WITH DIFFERENTIAL/PLATELET  COMPREHENSIVE METABOLIC PANEL    Imaging Review Ct Head Wo Contrast  12/31/2014   CLINICAL DATA:  Headache today throughout entire head, possible seizure last night, former smoker  EXAM: CT HEAD WITHOUT CONTRAST  TECHNIQUE: Contiguous axial images were obtained from the base of the skull through the vertex without intravenous contrast.  COMPARISON:  None  FINDINGS: Normal ventricular morphology.  No midline shift or mass effect.  Normal appearance of brain parenchyma.  No intracranial hemorrhage, mass lesion, or acute infarction.  No extra-axial fluid collections.  Visualized paranasal sinuses and mastoid air cells clear.  Incomplete posterior arch C1, normal variant.  Remaining osseous structures unremarkable.  IMPRESSION: Normal exam.  If patient has persistent or recurrent seizures, recommend MR imaging with and without contrast for further assessment.   Electronically Signed   By: Ulyses SouthwardMark  Boles M.D.   On: 12/31/2014 11:55     EKG Interpretation   Date/Time:  Tuesday December 31 2014 12:17:53 EDT Ventricular Rate:  57 PR Interval:  144 QRS Duration: 90 QT Interval:  396 QTC Calculation: 385 R Axis:    62 Text Interpretation:  Sinus bradycardia with marked sinus arrhythmia  Otherwise normal ECG No significant change was found Confirmed by Manus GunningANCOUR   MD, Samuele Storey [phone]([id number]) on 12/31/2014 12:22:24 PM      MDM   Final diagnoses:  Midline low back pain, with sciatica presence unspecified  Headache, unspecified headache type  Low back pain that onset yesterday.  Now with "migraine" pain since this morning, gradual in onset. Associated with 3 episodes of vomiting. Questionable seizure activity per husband while patient was sleeping last night. No history of same.  UA contaminated.  Neuro intact.  With possible seizure activity, will obtain CT head. HCG negative.  CT wnl.  Neuro exam normal. Headache improved with migraine cocktail. No evidence of SAH or meningitis.  Tolerating PO, ambulatory. UDS with THC. Instructed to avoid.  Follow up with PCP and neurology.  I personally performed the services described in this documentation, which was scribed in my presence. The recorded information has been reviewed and is accurate.   Glynn OctaveStephen Sheryl Saintil, MD 12/31/14 [phone]

## 2014-12-31 NOTE — Discharge Instructions (Signed)
General Headache Without Cause As discussed, it is uncertain whether you had a seizure. Stop using marijuana. Follow up with the neurologist. Return to the ED if you develop new or worsening symptoms. A headache is pain or discomfort felt around the head or neck area. The specific cause of a headache may not be found. There are many causes and types of headaches. A few common ones are:  Tension headaches.  Migraine headaches.  Cluster headaches.  Chronic daily headaches. HOME CARE INSTRUCTIONS   Keep all follow-up appointments with your caregiver or any specialist referral.  Only take over-the-counter or prescription medicines for pain or discomfort as directed by your caregiver.  Lie down in a dark, quiet room when you have a headache.  Keep a headache journal to find out what may trigger your migraine headaches. For example, write down:  What you eat and drink.  How much sleep you get.  Any change to your diet or medicines.  Try massage or other relaxation techniques.  Put ice packs or heat on the head and neck. Use these 3 to 4 times per day for 15 to 20 minutes each time, or as needed.  Limit stress.  Sit up straight, and do not tense your muscles.  Quit smoking if you smoke.  Limit alcohol use.  Decrease the amount of caffeine you drink, or stop drinking caffeine.  Eat and sleep on a regular schedule.  Get 7 to 9 hours of sleep, or as recommended by your caregiver.  Keep lights dim if bright lights bother you and make your headaches worse. SEEK MEDICAL CARE IF:   You have problems with the medicines you were prescribed.  Your medicines are not working.  You have a change from the usual headache.  You have nausea or vomiting. SEEK IMMEDIATE MEDICAL CARE IF:   Your headache becomes severe.  You have a fever.  You have a stiff neck.  You have loss of vision.  You have muscular weakness or loss of muscle control.  You start losing your balance or  have trouble walking.  You feel faint or pass out.  You have severe symptoms that are different from your first symptoms. MAKE SURE YOU:   Understand these instructions.  Will watch your condition.  Will get help right away if you are not doing well or get worse. Document Released: 09/27/2005 Document Revised: 12/20/2011 Document Reviewed: 10/13/2011 [facility] Patient Information 2015 [place]ExitCare, [place]. This information is not intended to replace advice given to you by your health care provider. Make sure you discuss any questions you have with your health care provider.

## 2014-12-31 NOTE — ED Notes (Signed)
Pt given 240cc sprite for po challenge.

## 2014-12-31 NOTE — ED Notes (Signed)
Pt reports feels much better. Verbalizes understanding of D/C instructions

## 2014-12-31 NOTE — ED Notes (Signed)
Pt amb to room 12 with quick steady gait in nad. Pt reports sweating yesterday, today c/o emesis x 3 this am. Pt fiancee states he thinks she had a seizure in her sleep last night. Pt is awake and alert, in nad.

## 2015-05-17 ENCOUNTER — Emergency Department (HOSPITAL_BASED_OUTPATIENT_CLINIC_OR_DEPARTMENT_OTHER)
Admission: EM | Admit: 2015-05-17 | Discharge: 2015-05-17 | Disposition: A | Discharge status: Home / Self Care | Payer: Self-pay | Attending: Emergency Medicine | Admitting: Emergency Medicine

## 2015-05-17 ENCOUNTER — Encounter (HOSPITAL_BASED_OUTPATIENT_CLINIC_OR_DEPARTMENT_OTHER): Payer: Self-pay | Admitting: *Deleted

## 2015-05-17 DIAGNOSIS — Z8701 Personal history of pneumonia (recurrent): Secondary | ICD-10-CM | POA: Insufficient documentation

## 2015-05-17 DIAGNOSIS — Z87891 Personal history of nicotine dependence: Secondary | ICD-10-CM | POA: Insufficient documentation

## 2015-05-17 DIAGNOSIS — Z792 Long term (current) use of antibiotics: Secondary | ICD-10-CM | POA: Insufficient documentation

## 2015-05-17 DIAGNOSIS — Z880 Allergy status to penicillin: Secondary | ICD-10-CM | POA: Insufficient documentation

## 2015-05-17 DIAGNOSIS — K625 Hemorrhage of anus and rectum: Secondary | ICD-10-CM | POA: Insufficient documentation

## 2015-05-17 DIAGNOSIS — G43909 Migraine, unspecified, not intractable, without status migrainosus: Secondary | ICD-10-CM | POA: Insufficient documentation

## 2015-05-17 LAB — OCCULT BLOOD X 1 CARD TO LAB, STOOL: FECAL OCCULT BLD: NEGATIVE

## 2015-05-17 NOTE — ED Notes (Signed)
Hemocult card sample obtained by EDP, to lab

## 2015-05-17 NOTE — Discharge Instructions (Signed)
Bloody Stools Call Dr.Jacobs's office at [facility] gastroenterology next week to schedule an office visit. Bloody stools means there is blood in your poop (stool). It is a sign that there is a problem somewhere in the digestive system. It is important for your doctor to find the cause of your bleeding, so the problem can be treated.  HOME CARE  Only take medicine as told by your doctor.  Eat foods with fiber (prunes, bran cereals).  Drink enough fluids to keep your pee (urine) clear or pale yellow.  Sit in warm water (sitz bath) for 10 to 15 minutes as told by your doctor.  Know how to take your medicines (enemas, suppositories) if advised by your doctor.  Watch for signs that you are getting better or getting worse. GET HELP RIGHT AWAY IF:   You are not getting better.  You start to get better but then get worse again.  You have new problems.  You have severe bleeding from the place where poop comes out (rectum) that does not stop.  You throw up (vomit) blood.  You feel weak or pass out (faint).  You have a fever. MAKE SURE YOU:   Understand these instructions.  Will watch your condition.  Will get help right away if you are not doing well or get worse. Document Released: 09/15/2009 Document Revised: 12/20/2011 Document Reviewed: 02/12/2011 [facility] Patient Information 2015 [place], [place]. This information is not intended to replace advice given to you by your health care provider. Make sure you discuss any questions you have with your health care provider.

## 2015-05-17 NOTE — ED Notes (Signed)
Family at bedside. 

## 2015-05-17 NOTE — ED Notes (Signed)
Pt presents with having blood in stools, onset this past Wednesday, denies any pain, poor appetite also

## 2015-05-17 NOTE — ED Provider Notes (Signed)
CSN: [id number]     Arrival date & time 05/17/15  0802 History   First MD Initiated Contact with Patient 05/17/15 [phone]     No chief complaint on file.  Chief complaint blood in stool  (Consider location/radiation/quality/duration/timing/severity/associated sxs/prior Treatment) HPI Complains of blood mixed with stool which started 3 days ago. Patient states the past for bowel movements have had anywhere from 1 teaspoon to 1 tablespoon of blood mixed in with stool. She is asymptomatic. No treatment prior to coming here. No lightheadedness. No other associated symptoms. Past Medical History  Diagnosis Date  . Pneumonia   . Acid reflux   . Migraine    No past surgical history on file. No family history on file. History  Substance Use Topics  . Smoking status: Former Smoker    Quit date: 03/14/2013  . Smokeless tobacco: Never Used  . Alcohol Use: Yes     Comment: occassionally   positive smoker occasional alcohol no illicit drug use OB History    No data available     Review of Systems  Constitutional: Negative.   HENT: Negative.   Respiratory: Negative.   Cardiovascular: Negative.   Gastrointestinal: Positive for blood in stool. Negative for abdominal pain.  Genitourinary:       Menses irregular  Musculoskeletal: Negative.   Skin: Negative.   Neurological: Negative.  Negative for light-headedness.  Hematological: Negative.  Does not bruise/bleed easily.  Psychiatric/Behavioral: Negative.   All other systems reviewed and are negative.     Allergies  Zithromax and Penicillins  Home Medications   Prior to Admission medications   Medication Sig Start Date End Date Taking? Authorizing Provider  butalbital-acetaminophen-caffeine (FIORICET) 50-325-40 MG per tablet Take 1-2 tablets by mouth every 6 (six) hours as needed for headache. 08/30/14 08/30/15  Fayrene Helper, PA-C  clindamycin (CLEOCIN) 300 MG capsule Take 300 mg by mouth 3 (three) times daily.    Historical Provider,  MD  traMADol (ULTRAM) 50 MG tablet Take 1 tablet (50 mg total) by mouth every 6 (six) hours as needed. 10/18/14   Geoffery Lyons, MD   BP 138/57 mmHg  Pulse 88  Temp(Src) 98.6 F (37 C) (Oral)  Resp 20  Ht 5\' 6"  (1.676 m)  Wt 275 lb (124.739 kg)  BMI 44.41 kg/m2  SpO2 100% Physical Exam  Constitutional: She appears well-developed and well-nourished.  HENT:  Head: Normocephalic and atraumatic.  Eyes: Conjunctivae are normal. Pupils are equal, round, and reactive to light.  Neck: Neck supple. No tracheal deviation present. No thyromegaly present.  Cardiovascular: Normal rate and regular rhythm.   No murmur heard. Pulmonary/Chest: Effort normal and breath sounds normal.  Abdominal: Soft. Bowel sounds are normal. She exhibits no distension. There is no tenderness.  Morbidly obese  Genitourinary:  Rectal normal tone brown stool no gross blood  Musculoskeletal: Normal range of motion. She exhibits no edema or tenderness.  Neurological: She is alert. Coordination normal.  Skin: Skin is warm and dry. No rash noted.  Psychiatric: She has a normal mood and affect.  Nursing note and vitals reviewed.   ED Course  Procedures (including critical care time) Labs Review Labs Reviewed - No data to display  Imaging Review No results found.   EKG Interpretation None     8:50 AM resting comfortably remains asymptomatic. Results for orders placed or performed during the hospital encounter of 05/17/15  Occult blood card to lab, stool Provider will collect  Result Value Ref Range   Fecal Occult Bld  NEGATIVE NEGATIVE   No results found.  MDM  Plan gastroenterology referral Dr. Christella Hartigan Diagnosis rectal bleeding Final diagnoses:  None        Doug Sou, MD 05/17/15 [phone]

## 2015-05-17 NOTE — ED Notes (Signed)
MD at bedside. 

## 2015-05-17 NOTE — ED Notes (Signed)
Pt presents with having "some blood clots in stool", denies any pain or discomfort at this time

## 2015-05-19 IMAGING — CR DG CHEST 2V
2 series · 2 of 2 positions shown · non-contrast
Comparison: December 26, 2012

CLINICAL DATA: Pain radiating into arms

EXAM:
CHEST  2 VIEW

[w chest pa]
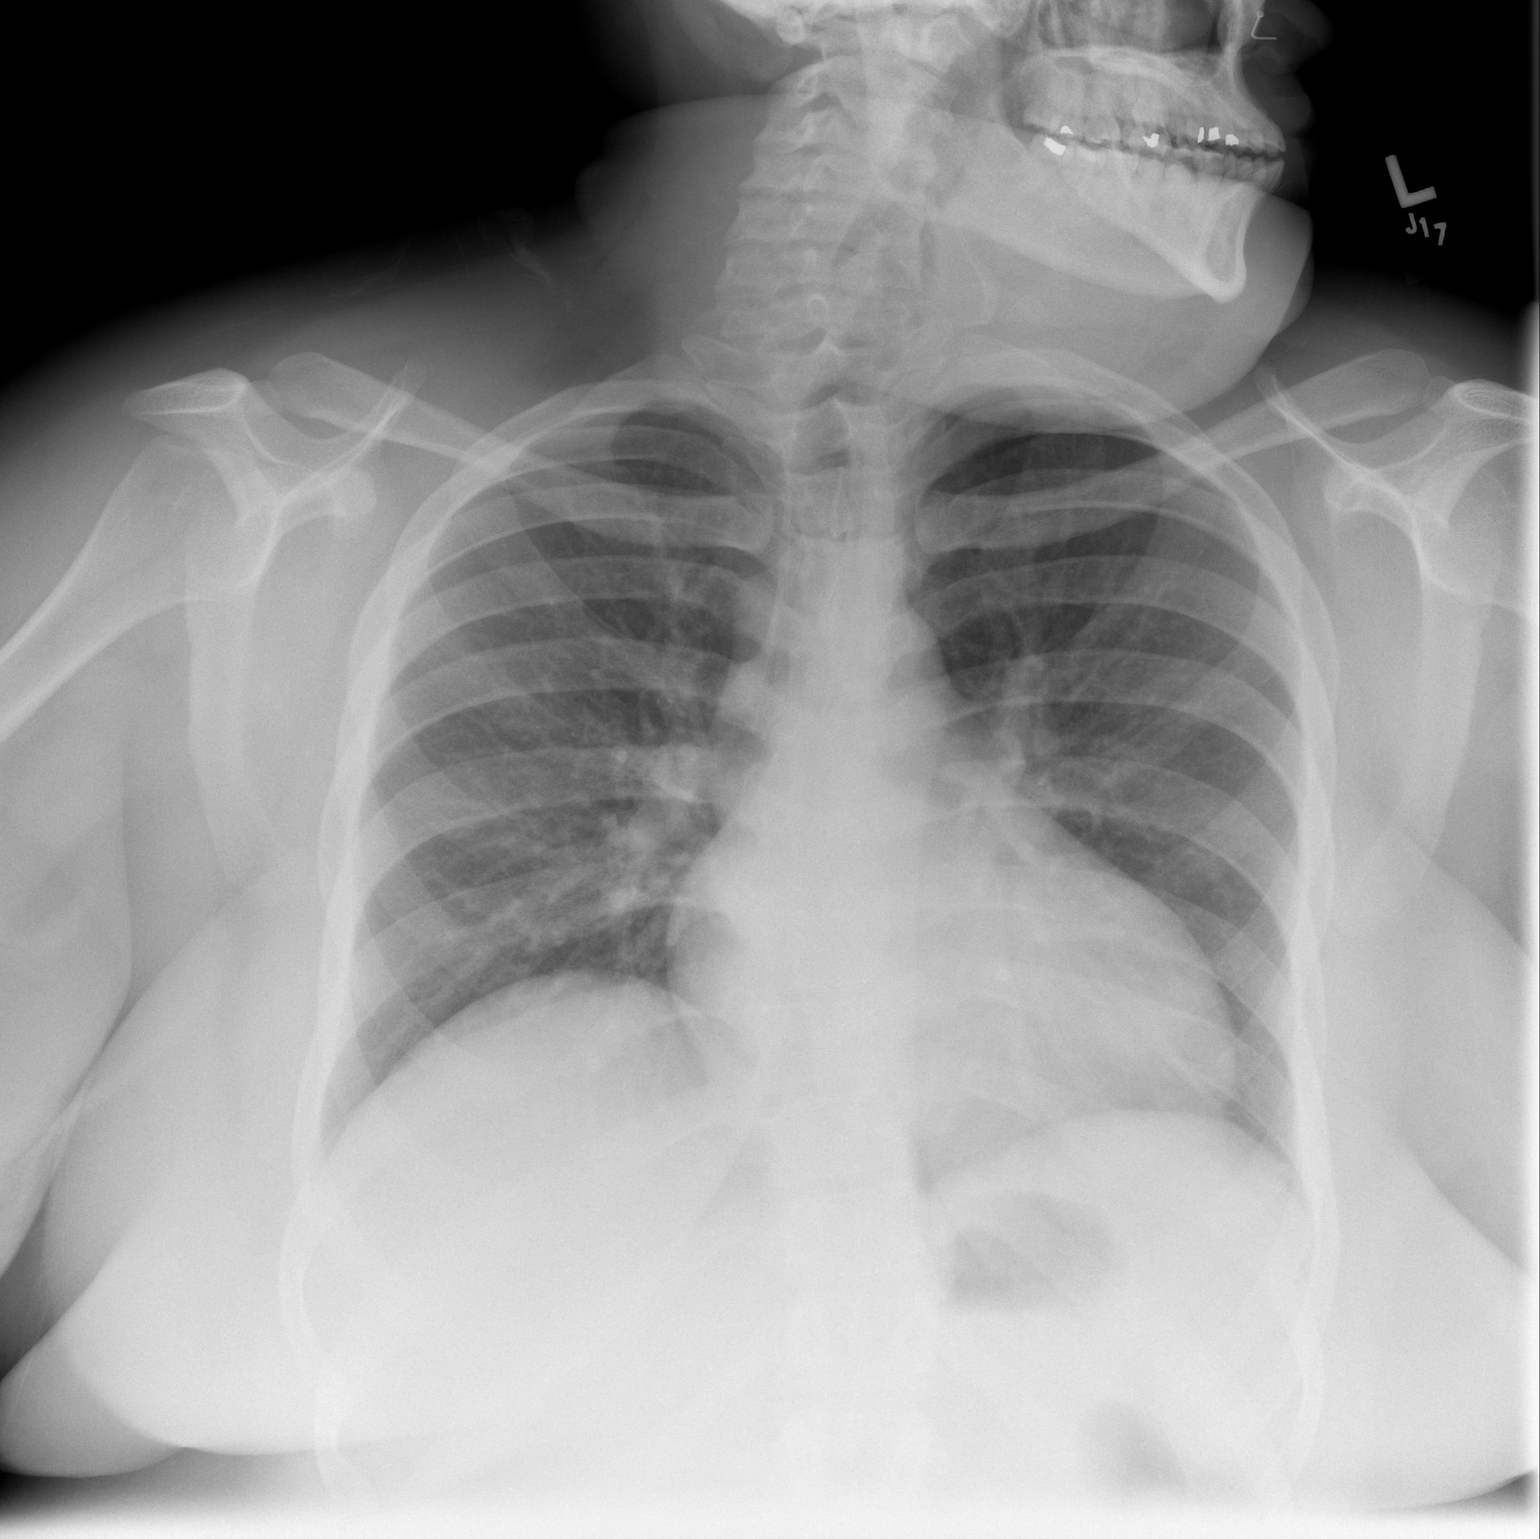

[w chest lat]
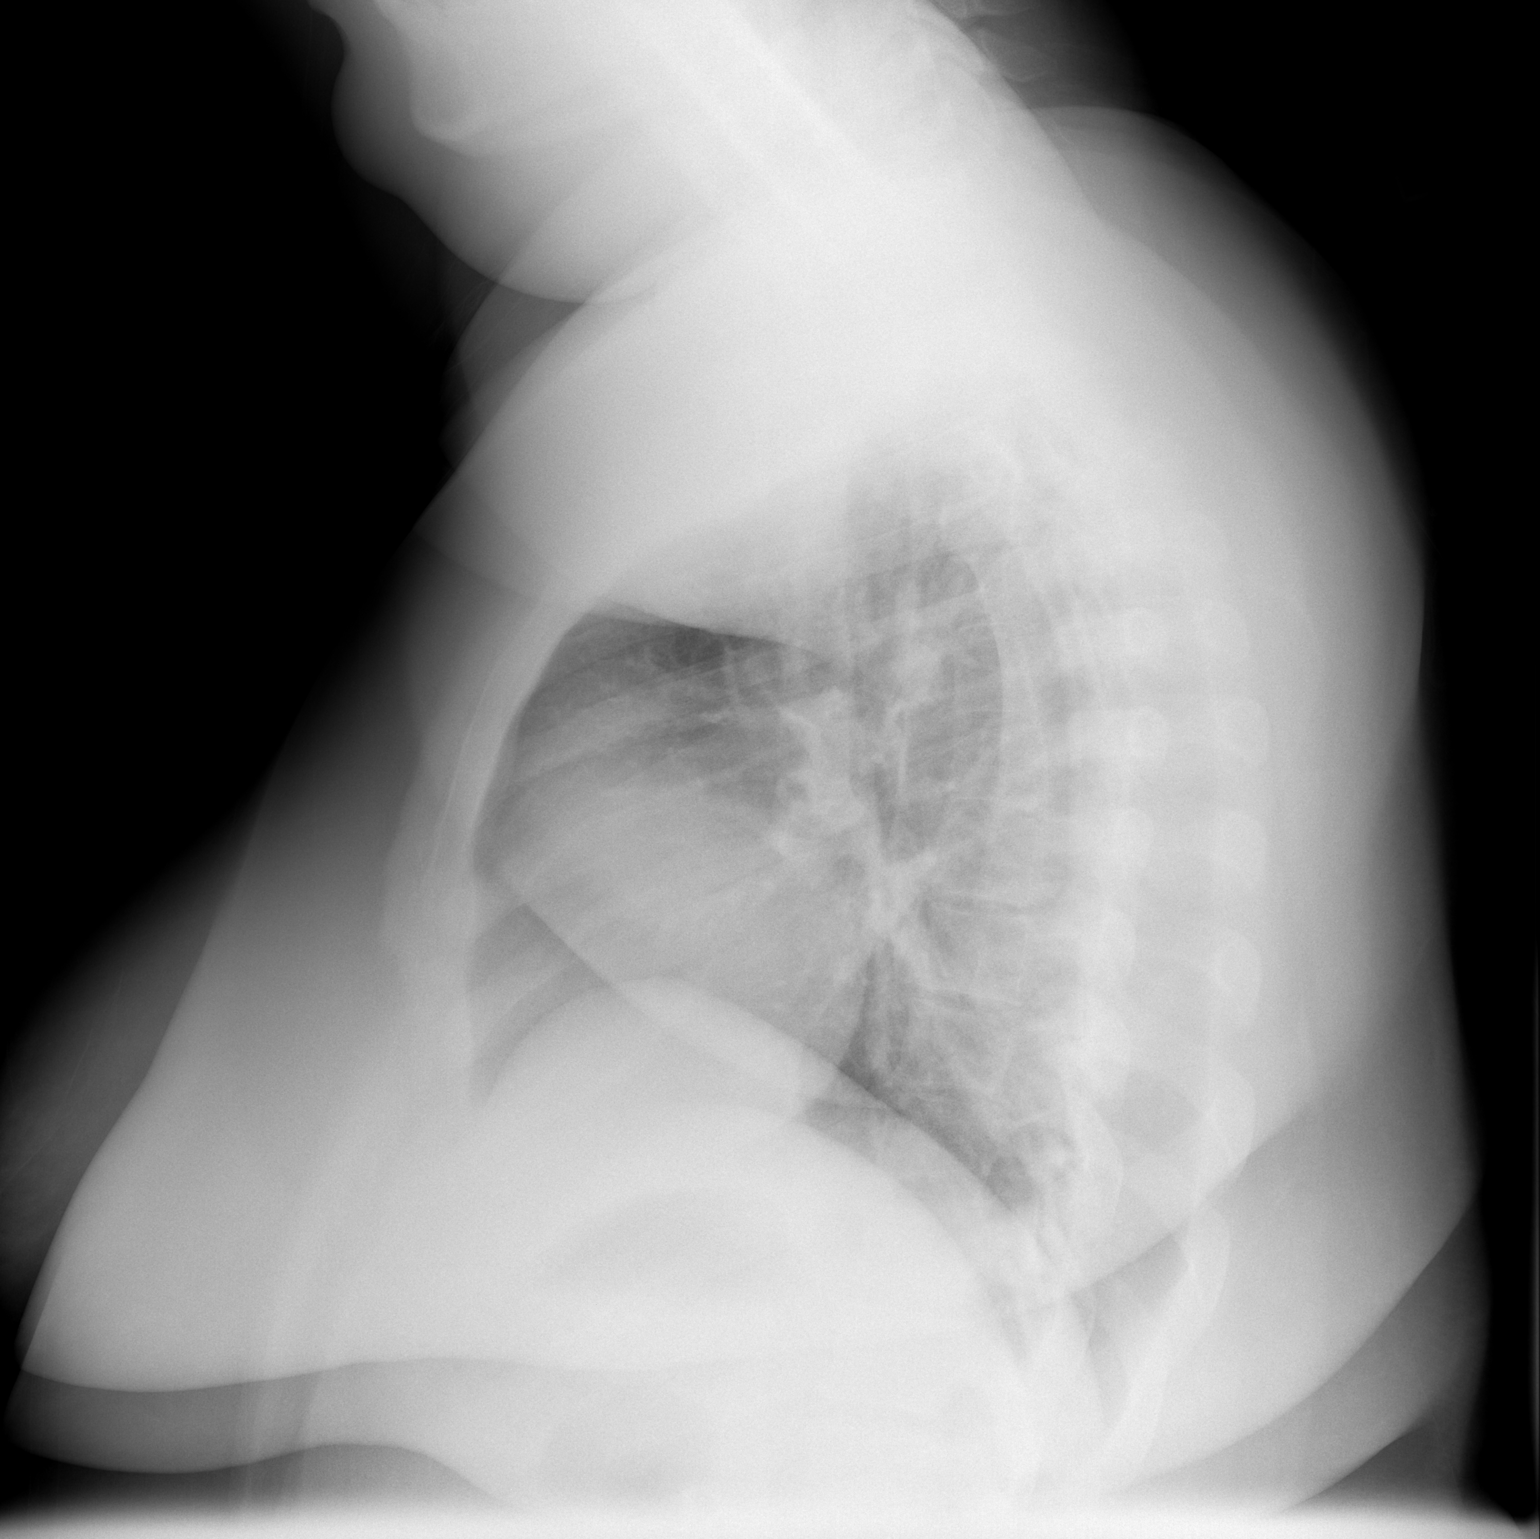

[2 of 2 positions shown; findings below may reference images not displayed]

FINDINGS: Lungs are clear. Heart size and pulmonary vascularity are normal. No
adenopathy. There is lower thoracic levoscoliosis.
IMPRESSION: No edema or consolidation.

## 2015-12-14 ENCOUNTER — Encounter (HOSPITAL_BASED_OUTPATIENT_CLINIC_OR_DEPARTMENT_OTHER): Payer: Self-pay | Admitting: *Deleted

## 2015-12-14 ENCOUNTER — Emergency Department (HOSPITAL_BASED_OUTPATIENT_CLINIC_OR_DEPARTMENT_OTHER): Payer: Self-pay

## 2015-12-14 ENCOUNTER — Emergency Department (HOSPITAL_BASED_OUTPATIENT_CLINIC_OR_DEPARTMENT_OTHER)
Admission: EM | Admit: 2015-12-14 | Discharge: 2015-12-14 | Disposition: A | Discharge status: Home / Self Care | Payer: Self-pay | Attending: Emergency Medicine | Admitting: Emergency Medicine

## 2015-12-14 DIAGNOSIS — M546 Pain in thoracic spine: Secondary | ICD-10-CM | POA: Insufficient documentation

## 2015-12-14 DIAGNOSIS — R001 Bradycardia, unspecified: Secondary | ICD-10-CM | POA: Insufficient documentation

## 2015-12-14 DIAGNOSIS — Z3202 Encounter for pregnancy test, result negative: Secondary | ICD-10-CM | POA: Insufficient documentation

## 2015-12-14 DIAGNOSIS — F17210 Nicotine dependence, cigarettes, uncomplicated: Secondary | ICD-10-CM | POA: Insufficient documentation

## 2015-12-14 DIAGNOSIS — Z880 Allergy status to penicillin: Secondary | ICD-10-CM | POA: Insufficient documentation

## 2015-12-14 DIAGNOSIS — Z8701 Personal history of pneumonia (recurrent): Secondary | ICD-10-CM | POA: Insufficient documentation

## 2015-12-14 DIAGNOSIS — Z792 Long term (current) use of antibiotics: Secondary | ICD-10-CM | POA: Insufficient documentation

## 2015-12-14 DIAGNOSIS — R0981 Nasal congestion: Secondary | ICD-10-CM | POA: Insufficient documentation

## 2015-12-14 DIAGNOSIS — R05 Cough: Secondary | ICD-10-CM | POA: Insufficient documentation

## 2015-12-14 DIAGNOSIS — Z8679 Personal history of other diseases of the circulatory system: Secondary | ICD-10-CM | POA: Insufficient documentation

## 2015-12-14 DIAGNOSIS — R0789 Other chest pain: Secondary | ICD-10-CM | POA: Insufficient documentation

## 2015-12-14 DIAGNOSIS — Z8719 Personal history of other diseases of the digestive system: Secondary | ICD-10-CM | POA: Insufficient documentation

## 2015-12-14 LAB — BASIC METABOLIC PANEL
Anion gap: 7 (ref 5–15)
BUN: 11 mg/dL (ref 6–20)
CHLORIDE: 105 mmol/L (ref 101–111)
CO2: 27 mmol/L (ref 22–32)
CREATININE: 0.62 mg/dL (ref 0.44–1.00)
Calcium: 8.9 mg/dL (ref 8.9–10.3)
GFR calc non Af Amer: >60 mL/min (ref >60)
Glucose, Bld: 93 mg/dL (ref 65–99)
POTASSIUM: 4.3 mmol/L (ref 3.5–5.1)
Sodium: 139 mmol/L (ref 135–145)

## 2015-12-14 LAB — CBC
HEMATOCRIT: 37.1 % (ref 36.0–46.0)
Hemoglobin: 12 g/dL (ref 12.0–15.0)
MCH: 30.5 pg (ref 26.0–34.0)
MCHC: 32.3 g/dL (ref 30.0–36.0)
MCV: 94.2 fL (ref 78.0–100.0)
PLATELETS: 241 10*3/uL (ref 150–400)
RBC: 3.94 MIL/uL (ref 3.87–5.11)
RDW: 13.1 % (ref 11.5–15.5)
WBC: 6 10*3/uL (ref 4.0–10.5)

## 2015-12-14 LAB — TROPONIN I

## 2015-12-14 LAB — MAGNESIUM: Magnesium: 1.9 mg/dL (ref 1.7–2.4)

## 2015-12-14 LAB — PREGNANCY, URINE: Preg Test, Ur: NEGATIVE

## 2015-12-14 NOTE — Discharge Instructions (Signed)
We did not find serious cause of your chest pain today. Your chest x-ray does not show a pneumonia. Please continue to take Motrin and Tylenol as needed for pain control. Follow up closely with her primary care doctor. Return without fail for worsening symptoms, including worsening pain  difficulty breathing, passing out, fevers, or any other symptoms concerning to you.(especially with exertion),   Nonspecific Chest Pain It is often hard to find the cause of chest pain. There is always a chance that your pain could be related to something serious, such as a heart attack or a blood clot in your lungs. Chest pain can also be caused by conditions that are not life-threatening. If you have chest pain, it is very important to follow up with your doctor.  HOME CARE  If you were prescribed an antibiotic medicine, finish it all even if you start to feel better.  Avoid any activities that cause chest pain.  Do not use any tobacco products, including cigarettes, chewing tobacco, or electronic cigarettes. If you need help quitting, ask your doctor.  Do not drink alcohol.  Take medicines only as told by your doctor.  Keep all follow-up visits as told by your doctor. This is important. This includes any further testing if your chest pain does not go away.  Your doctor may tell you to keep your head raised (elevated) while you sleep.  Make lifestyle changes as told by your doctor. These may include:  Getting regular exercise. Ask your doctor to suggest some activities that are safe for you.  Eating a heart-healthy diet. Your doctor or a diet specialist (dietitian) can help you to learn healthy eating options.  Maintaining a healthy weight.  Managing diabetes, if necessary.  Reducing stress. GET HELP IF:  Your chest pain does not go away, even after treatment.  You have a rash with blisters on your chest.  You have a fever. GET HELP RIGHT AWAY IF:  Your chest pain is worse.  You have an  increasing cough, or you cough up blood.  You have severe belly (abdominal) pain.  You feel extremely weak.  You pass out (faint).  You have chills.  You have sudden, unexplained chest discomfort.  You have sudden, unexplained discomfort in your arms, back, neck, or jaw.  You have shortness of breath at any time.  You suddenly start to sweat, or your skin gets clammy.  You feel nauseous.  You vomit.  You suddenly feel light-headed or dizzy.  Your heart begins to beat quickly, or it feels like it is skipping beats. These symptoms may be an emergency. Do not wait to see if the symptoms will go away. Get medical help right away. Call your local emergency services (911 in the [place] Do not drive yourself to the hospital.   This information is not intended to replace advice given to you by your health care provider. Make sure you discuss any questions you have with your health care provider.   Document Released: 03/15/2008 Document Revised: 10/18/2014 Document Reviewed: 05/03/2014 Elsevier Interactive Patient Education [employer]2016 [employer].

## 2015-12-14 NOTE — ED Notes (Signed)
Patient transported to X-ray 

## 2015-12-14 NOTE — ED Provider Notes (Addendum)
CSN: [id number]     Arrival date & time 12/14/15  4098 History   First MD Initiated Contact with Patient 12/14/15 [phone]     Chief Complaint  Patient presents with  . Chest Pain     (Consider location/radiation/quality/duration/timing/severity/associated sxs/prior Treatment) HPI 25 year old female who presents with intermittent chest pain over the past 3 days. History of migraine headaches. And tobacco use. States that over the past 3 days she has had generalized discomfort over her anterior chest and upper right back. Since that this felt similar to when she had a pneumonia, and came to the ED for evaluation. Has had mild productive cough with congestion, but no fevers, difficulty breathing, sore throat or runny nose. States that pain in her chest seems to be worsened by tobacco use. Denies any pleuritic nature of pain, pain that is worsened with exertion, lower extremity edema or pain, orthopnea or PND. No prior history of PE/DVT or family history and no recent immobilization. Past Medical History  Diagnosis Date  . Pneumonia   . Acid reflux   . Migraine    History reviewed. No pertinent past surgical history. History reviewed. No pertinent family history. Social History  Substance Use Topics  . Smoking status: Current Every Day Smoker -- 0.50 packs/day    Types: Cigarettes  . Smokeless tobacco: Never Used  . Alcohol Use: Yes     Comment: occassionally   OB History    No data available     Review of Systems 10/14 systems reviewed and are negative other than those stated in the HPI    Allergies  Zithromax and Penicillins  Home Medications   Prior to Admission medications   Medication Sig Start Date End Date Taking? Authorizing Provider  clindamycin (CLEOCIN) 300 MG capsule Take 300 mg by mouth 3 (three) times daily.    Historical Provider, MD  traMADol (ULTRAM) 50 MG tablet Take 1 tablet (50 mg total) by mouth every 6 (six) hours as needed. 10/18/14   Geoffery Lyons, MD   BP  127/77 mmHg  Pulse 78  Temp(Src) 98.8 F (37.1 C) (Oral)  Resp 20  Ht  (1.676 m)  Wt 250 lb (113.399 kg)  BMI 40.37 kg/m2  SpO2 100%  LMP 12/04/2015 Physical Exam Physical Exam  Nursing note and vitals reviewed. Constitutional: Well developed, well nourished, non-toxic, and in no acute distress Head: Normocephalic and atraumatic.  Mouth/Throat: Oropharynx is clear and moist.  Neck: Normal range of motion. Neck supple.  Cardiovascular: Bradycardic rate and regular rhythm.  No LE edema or pain Pulmonary/Chest: Effort normal and breath sounds normal.  Abdominal: Soft. There is no tenderness. There is no rebound and no guarding.  Musculoskeletal: Normal range of motion.  Neurological: Alert, no facial droop, fluent speech, moves all extremities symmetrically Skin: Skin is warm and dry.  Psychiatric: Cooperative  ED Course  Procedures (including critical care time) Labs Review Labs Reviewed  BASIC METABOLIC PANEL  CBC  TROPONIN I  PREGNANCY, URINE  MAGNESIUM    Imaging Review Dg Chest 2 View  12/14/2015  CLINICAL DATA:  Pt c/o intermittent substernal chest tightness that radiates bilaterally x 3 days. Pt denies any fever. Smoker x 1 ppd EXAM: CHEST  2 VIEW COMPARISON:  10/18/2014 FINDINGS: Midline trachea.  Normal heart size and mediastinal contours. Sharp costophrenic angles.  No pneumothorax.  Clear lungs. Numerous leads and wires project over the chest. IMPRESSION: No active cardiopulmonary disease. Electronically Signed   By: Hosie Spangle.D.  On: 12/14/2015 09:22   I have personally reviewed and evaluated these images and lab results as part of my medical decision-making.   EKG Interpretation   Date/Time:  Sunday December 14 2015 09:03:59 EST Ventricular Rate:  49 PR Interval:  139 QRS Duration: 95 QT Interval:  429 QTC Calculation: 387 R Axis:   56 Text Interpretation:  Sinus bradycardia Atrial premature complexes No  significant change since last tracing  aside from PACs Confirmed by Jazelle Achey MD,  Neveyah Garzon ([id number]) on 12/14/2015 9:11:40 AM      MDM   Final diagnoses:  Atypical chest pain   25 year old female wi  th history of tobacco use who presents with chest pain. Pain seems very atypical. She is well-appearing and in no acute distress, with normal vital signs on arrival. Currently no significant chest pain. She is PERC negative without significant risk factors, and was ruled out for PE. PERC negative her EKG shows sinus bradycardia with occasional PACs, and she has documented history of prior sinus bradycardia. No ischemic changes, stigmata of arrhythmia, or heart strain. Chest x-ray does not show infiltrate or other acute cardiopulmonary processes. She is not pregnant, and the remainder of her blood work is unremarkable. At this time, I do not have concern for serious or life-threatening etiology of her symptoms. Discussed continue supportive care with PCP follow-up. I've also reviewed strict return instructions. She expressed understanding of all discharge instructions and felt comfortable with the plan of care   Lavera Guiseana Duo Analys Ryden, MD 12/14/15 1100  Lavera Guiseana Duo Elma Shands, MD 12/14/15 1101

## 2015-12-14 NOTE — ED Notes (Signed)
Pt reports intermittent substernal chest tightness x3 days, as well as rt flank pain. Pt denies any fever - admits to "recent cold."

## 2015-12-14 NOTE — ED Notes (Signed)
Patient instructed to change into hospital gown.

## 2018-09-20 ENCOUNTER — Emergency Department (HOSPITAL_BASED_OUTPATIENT_CLINIC_OR_DEPARTMENT_OTHER)
Admission: EM | Admit: 2018-09-20 | Discharge: 2018-09-20 | Disposition: A | Discharge status: Home / Self Care | Payer: Self-pay | Attending: Emergency Medicine | Admitting: Emergency Medicine

## 2018-09-20 ENCOUNTER — Other Ambulatory Visit: Payer: Self-pay

## 2018-09-20 ENCOUNTER — Encounter (HOSPITAL_BASED_OUTPATIENT_CLINIC_OR_DEPARTMENT_OTHER): Payer: Self-pay | Admitting: *Deleted

## 2018-09-20 DIAGNOSIS — F17210 Nicotine dependence, cigarettes, uncomplicated: Secondary | ICD-10-CM | POA: Insufficient documentation

## 2018-09-20 DIAGNOSIS — A599 Trichomoniasis, unspecified: Secondary | ICD-10-CM | POA: Insufficient documentation

## 2018-09-20 LAB — WET PREP, GENITAL
Sperm: NONE SEEN
YEAST WET PREP: NONE SEEN

## 2018-09-20 LAB — URINALYSIS, ROUTINE W REFLEX MICROSCOPIC
Bilirubin Urine: NEGATIVE
GLUCOSE, UA: NEGATIVE mg/dL
Hgb urine dipstick: NEGATIVE
Ketones, ur: NEGATIVE mg/dL
Nitrite: NEGATIVE
PH: 6 (ref 5.0–8.0)
Protein, ur: NEGATIVE mg/dL
Specific Gravity, Urine: 1.02 (ref 1.005–1.030)

## 2018-09-20 LAB — URINALYSIS, MICROSCOPIC (REFLEX): RBC / HPF: NONE SEEN RBC/hpf (ref 0–5)

## 2018-09-20 LAB — PREGNANCY, URINE: Preg Test, Ur: NEGATIVE

## 2018-09-20 MED ORDER — METRONIDAZOLE 500 MG PO TABS
2000.0000 mg | ORAL_TABLET | Freq: Once | ORAL | Status: AC
  Administered 2018-09-20: 2000 mg via ORAL
  Filled 2018-09-20: qty 4

## 2018-09-20 MED ORDER — ONDANSETRON 8 MG PO TBDP
8.0000 mg | ORAL_TABLET | Freq: Once | ORAL | Status: AC
  Administered 2018-09-20: 8 mg via ORAL
  Filled 2018-09-20: qty 1

## 2018-09-20 MED ORDER — LIDOCAINE HCL (PF) 1 % IJ SOLN
INTRAMUSCULAR | Status: AC
  Filled 2018-09-20: qty 5

## 2018-09-20 MED ORDER — METRONIDAZOLE 500 MG PO TABS: 500.0000 mg | ORAL_TABLET | Freq: Two times a day (BID) | ORAL | 0 refills | Status: AC

## 2018-09-20 MED ORDER — CEFTRIAXONE SODIUM 250 MG IJ SOLR
250.0000 mg | Freq: Once | INTRAMUSCULAR | Status: AC
Start: 2018-09-20 — End: 2018-09-20
  Administered 2018-09-20: 250 mg via INTRAMUSCULAR
  Filled 2018-09-20: qty 250

## 2018-09-20 MED ORDER — DOXYCYCLINE HYCLATE 100 MG PO TABS: 100.0000 mg | ORAL_TABLET | Freq: Two times a day (BID) | ORAL | 0 refills | Status: AC

## 2018-09-20 NOTE — ED Notes (Signed)
NAD at this time. Pt is stable and going home.  

## 2018-09-20 NOTE — ED Triage Notes (Signed)
Pt has had vaginal itching pain and discharge in the vaginal area for 4 days. Clear and yellow in color)

## 2018-09-20 NOTE — ED Provider Notes (Signed)
[facility] EMERGENCY DEPARTMENT Provider Note   CSN: [id number] Arrival date & time: 09/20/18  0745  History   Chief Complaint Chief Complaint  Patient presents with  . Vaginal Itching    HPI Jamie Archer is a 27 y.o. female with 4 days of water vaginal discharge. She reports she has been treated for trich in the past. She reports vaginal itching accompanying discharge. She also has a foul odor with urination. Denies pelvic pain or pain with urination. Denies flank pain. Endorses chills, no fever. She is sexually active with female partners and uses condoms for protection. She denies exposure to chlamydia or gonorrhea that she is aware of.  HPI  Past Medical History:  Diagnosis Date  . Acid reflux   . Migraine   . Pneumonia     Patient Active Problem List   Diagnosis Date Noted  . Migraine 10/26/2013    History reviewed. No pertinent surgical history.   OB History   None      Home Medications    Prior to Admission medications   Medication Sig Start Date End Date Taking? Authorizing Provider  doxycycline (VIBRA-TABS) 100 MG tablet Take 1 tablet (100 mg total) by mouth 2 (two) times daily for 7 days. 09/20/18 09/27/18  Tillman Sersiccio, Ferron Ishmael C, DO  metroNIDAZOLE (FLAGYL) 500 MG tablet Take 1 tablet (500 mg total) by mouth 2 (two) times daily for 7 days. 09/20/18 09/27/18  Tillman Sersiccio, Keilee Denman C, DO    Family History History reviewed. No pertinent family history.  Social History Social History   Tobacco Use  . Smoking status: Current Every Day Smoker    Packs/day: 0.50    Types: Cigarettes  . Smokeless tobacco: Never Used  Substance Use Topics  . Alcohol use: Yes    Comment: occassionally  . Drug use: No     Allergies   Zithromax [azithromycin] and Penicillins   Review of Systems Review of Systems  Constitutional: Positive for chills. Negative for activity change, appetite change and fever.  HENT: Negative for congestion and sore throat.   Eyes:  Negative for pain and discharge.  Respiratory: Negative for cough.   Cardiovascular: Negative for chest pain.  Gastrointestinal: Negative for nausea and vomiting.  Genitourinary: Positive for vaginal discharge. Negative for decreased urine volume, flank pain, frequency, genital sores, pelvic pain, urgency, vaginal bleeding and vaginal pain.  Musculoskeletal: Negative for myalgias.  Skin: Negative for rash.  Neurological: Negative for headaches.    Physical Exam Updated Vital Signs BP 127/83 (BP Location: Right Arm)   Pulse 88   Temp 98.3 F (36.8 C) (Oral)   Resp 18   Ht 5\' 6"  (1.676 m)   Wt 124.7 kg   LMP 08/12/2018 (Approximate)   SpO2 100%   BMI 44.39 kg/m   Physical Exam  Constitutional: She is oriented to person, place, and time. She appears well-developed and well-nourished. No distress.  HENT:  Head: Normocephalic.  Cardiovascular: Normal rate.  Pulmonary/Chest: Effort normal. No respiratory distress.  Abdominal: Soft.  Genitourinary: Vagina normal. Pelvic exam was performed with patient supine. There is no rash or lesion on the right labia. There is no rash or lesion on the left labia. Cervix exhibits discharge. Cervix exhibits no motion tenderness and no friability. Right adnexum displays no tenderness. Left adnexum displays no tenderness. No tenderness in the vagina.  Musculoskeletal: Normal range of motion.  Neurological: She is alert and oriented to person, place, and time.  Skin: Skin is warm and dry.  Psychiatric:  She has a normal mood and affect.  Nursing note and vitals reviewed.    ED Treatments / Results  Labs (all labs ordered are listed, but only abnormal results are displayed) Labs Reviewed  WET PREP, GENITAL - Abnormal; Notable for the following components:      Result Value   Trich, Wet Prep PRESENT (*)    Clue Cells Wet Prep HPF POC PRESENT (*)    WBC, Wet Prep HPF POC MODERATE (*)    All other components within normal limits  URINALYSIS,  ROUTINE W REFLEX MICROSCOPIC - Abnormal; Notable for the following components:   Leukocytes, UA TRACE (*)    All other components within normal limits  URINALYSIS, MICROSCOPIC (REFLEX) - Abnormal; Notable for the following components:   Bacteria, UA FEW (*)    All other components within normal limits  PREGNANCY, URINE  GC/CHLAMYDIA PROBE AMP ([place]) NOT AT [facility]    EKG None  Radiology No results found.  Procedures Procedures (including critical care time)  Medications Ordered in ED Medications  lidocaine (PF) (XYLOCAINE) 1 % injection (has no administration in time range)  ondansetron (ZOFRAN-ODT) disintegrating tablet 8 mg (8 mg Oral Given 09/20/18 0913)  metroNIDAZOLE (FLAGYL) tablet 2,000 mg (2,000 mg Oral Given 09/20/18 0913)  cefTRIAXone (ROCEPHIN) injection 250 mg (250 mg Intramuscular Given 09/20/18 0917)     Initial Impression / Assessment and Plan / ED Course  I have reviewed the triage vital signs and the nursing notes.  Pertinent labs & imaging results that were available during my care of the patient were reviewed by me and considered in my medical decision making (see chart for details).     27 year old with vaginal discharge. Wet prep positive for BV and trich. UA with leuks, no nitrites, upreg negative, gc/chlamydia sent to lab. Discussed results with patient and advised treatment for trich and presumptive gonorrhea/chlamydial infection as well. She is agreeable. Will treat with zofran for nausea prior to 2 grams flagyl and 250 mg IM ceftriaxone. Patient with allergy to azithro, will give rx for doxy 100 mg BID for 7 days to treat presumptive chlamydia and 7 days of flagyl 500 mg BID for BV. Advised her to inform her partners and abstain from intercourse for 7 days after treatment is complete. Patient verbalized understanding and agreement with plan.   Final Clinical Impressions(s) / ED Diagnoses   Final diagnoses:  Trichimoniasis    ED Discharge  Orders         Ordered    metroNIDAZOLE (FLAGYL) 500 MG tablet  2 times daily     09/20/18 0927    doxycycline (VIBRA-TABS) 100 MG tablet  2 times daily     09/20/18 0927          Dolores Patty, DO PGY-3, [facility] 09/20/2018 9:42 AM    Tillman Sers, DO 09/20/18 1610    Little, Ambrose Finland, MD 09/24/18 1329

## 2018-09-21 LAB — GC/CHLAMYDIA PROBE AMP (~~LOC~~) NOT AT ARMC
Chlamydia: NEGATIVE
NEISSERIA GONORRHEA: NEGATIVE

## 2018-11-01 ENCOUNTER — Other Ambulatory Visit: Payer: Self-pay

## 2018-11-01 ENCOUNTER — Encounter (HOSPITAL_BASED_OUTPATIENT_CLINIC_OR_DEPARTMENT_OTHER): Payer: Self-pay | Admitting: *Deleted

## 2018-11-01 ENCOUNTER — Emergency Department (HOSPITAL_BASED_OUTPATIENT_CLINIC_OR_DEPARTMENT_OTHER)
Admission: EM | Admit: 2018-11-01 | Discharge: 2018-11-01 | Disposition: A | Discharge status: Home / Self Care | Payer: Self-pay | Attending: Emergency Medicine | Admitting: Emergency Medicine

## 2018-11-01 DIAGNOSIS — F17210 Nicotine dependence, cigarettes, uncomplicated: Secondary | ICD-10-CM | POA: Insufficient documentation

## 2018-11-01 DIAGNOSIS — J020 Streptococcal pharyngitis: Secondary | ICD-10-CM | POA: Insufficient documentation

## 2018-11-01 LAB — GROUP A STREP BY PCR: Group A Strep by PCR: DETECTED — AB

## 2018-11-01 MED ORDER — CEPHALEXIN 500 MG PO CAPS: 500.0000 mg | ORAL_CAPSULE | Freq: Two times a day (BID) | ORAL | 0 refills | Status: AC

## 2018-11-01 MED FILL — CEPHALEXIN 500 MG CAPSULE: 500 | 10 days supply | Qty: 20 | Fill #0

## 2018-11-01 NOTE — ED Triage Notes (Signed)
Pt started not feeling well yesterday, cough and sore throat. She now has pain in her body.

## 2018-11-01 NOTE — ED Notes (Signed)
NAD at this time. Pt is stable and going home.  

## 2018-11-01 NOTE — ED Provider Notes (Signed)
[facility] EMERGENCY DEPARTMENT Provider Note   CSN: [id number] Arrival date & time: 11/01/18  2863     History   Chief Complaint Chief Complaint  Patient presents with  . Sore Throat  . Cough    HPI Jamie Archer is a 28 y.o. female.  28yo F w/ h/o GERD, migraines who p/w cough, body aches, sore throat. Yesterday she began having body aches followed by cough, sore throat, and mild runny nose. No diarrhea, mild vomiting. No known sick contacts. No recent travel. She took alka seltzer cold and flu this morning PTA.    Sore Throat   Cough    Past Medical History:  Diagnosis Date  . Acid reflux   . Migraine   . Pneumonia     Patient Active Problem List   Diagnosis Date Noted  . Migraine 10/26/2013    History reviewed. No pertinent surgical history.   OB History   No obstetric history on file.      Home Medications    Prior to Admission medications   Medication Sig Start Date End Date Taking? Authorizing Provider  cephALEXin (KEFLEX) 500 MG capsule Take 1 capsule (500 mg total) by mouth 2 (two) times daily for 10 days. 11/01/18 11/11/18  Earline Stiner, Ambrose Finland, MD    Family History History reviewed. No pertinent family history.  Social History Social History   Tobacco Use  . Smoking status: Current Every Day Smoker    Packs/day: 0.50    Types: Cigarettes  . Smokeless tobacco: Never Used  Substance Use Topics  . Alcohol use: Yes    Comment: occassionally  . Drug use: No     Allergies   Zithromax [azithromycin] and Penicillins   Review of Systems Review of Systems  Respiratory: Positive for cough.    All other systems reviewed and are negative except that which was mentioned in HPI   Physical Exam Updated Vital Signs BP 131/68 (BP Location: Left Arm)   Pulse 72   Temp 98.8 F (37.1 C) (Oral)   Resp 18   Ht 5\' 5"  (1.651 m)   Wt 124.7 kg   LMP 10/05/2018 (Exact Date)   SpO2 100%   BMI 45.76 kg/m   Physical Exam Vitals  signs and nursing note reviewed.  Constitutional:      General: She is not in acute distress.    Appearance: She is well-developed.  HENT:     Head: Normocephalic and atraumatic.     Mouth/Throat:     Mouth: Mucous membranes are moist.     Pharynx: Oropharynx is clear. Uvula midline.     Tonsils: Tonsillar exudate present. Swelling: 2+ on the right. 2+ on the left.  Eyes:     Conjunctiva/sclera: Conjunctivae normal.     Pupils: Pupils are equal, round, and reactive to light.  Neck:     Musculoskeletal: Neck supple.  Cardiovascular:     Rate and Rhythm: Normal rate and regular rhythm.     Heart sounds: Normal heart sounds. No murmur.  Pulmonary:     Effort: Pulmonary effort is normal.     Breath sounds: Normal breath sounds.  Abdominal:     General: Bowel sounds are normal. There is no distension.     Palpations: Abdomen is soft.     Tenderness: There is no abdominal tenderness.  Skin:    General: Skin is warm and dry.  Neurological:     Mental Status: She is alert and oriented to person, place, and  time.     Comments: Fluent speech  Psychiatric:        Judgment: Judgment normal.      ED Treatments / Results  Labs (all labs ordered are listed, but only abnormal results are displayed) Labs Reviewed  GROUP A STREP BY PCR - Abnormal; Notable for the following components:      Result Value   Group A Strep by PCR DETECTED (*)    All other components within normal limits    EKG None  Radiology No results found.  Procedures Procedures (including critical care time)  Medications Ordered in ED Medications - No data to display   Initial Impression / Assessment and Plan / ED Course  I have reviewed the triage vital signs and the nursing notes.  Pertinent labs & imaging results that were available during my care of the patient were reviewed by me and considered in my medical decision making (see chart for details).     She did have some tonsillar exudates on exam  without any evidence of PTA. Strep is positive, therefore will treat w/ abx. Minor reaction to PCN, will give keflex.  Supportive measures and reviewed return precautions.  She voiced understanding. Final Clinical Impressions(s) / ED Diagnoses   Final diagnoses:  Strep pharyngitis    ED Discharge Orders         Ordered    cephALEXin (KEFLEX) 500 MG capsule  2 times daily     11/01/18 0917           Shanie Mauzy, Ambrose Finland, MD 11/01/18 [phone]

## 2019-06-23 ENCOUNTER — Emergency Department (HOSPITAL_BASED_OUTPATIENT_CLINIC_OR_DEPARTMENT_OTHER): Payer: Self-pay

## 2019-06-23 ENCOUNTER — Encounter (HOSPITAL_BASED_OUTPATIENT_CLINIC_OR_DEPARTMENT_OTHER): Payer: Self-pay | Admitting: Emergency Medicine

## 2019-06-23 ENCOUNTER — Other Ambulatory Visit: Payer: Self-pay

## 2019-06-23 ENCOUNTER — Emergency Department (HOSPITAL_BASED_OUTPATIENT_CLINIC_OR_DEPARTMENT_OTHER)
Admission: EM | Admit: 2019-06-23 | Discharge: 2019-06-23 | Disposition: A | Discharge status: Home / Self Care | Payer: Self-pay | Attending: Emergency Medicine | Admitting: Emergency Medicine

## 2019-06-23 DIAGNOSIS — F17210 Nicotine dependence, cigarettes, uncomplicated: Secondary | ICD-10-CM | POA: Insufficient documentation

## 2019-06-23 DIAGNOSIS — R569 Unspecified convulsions: Secondary | ICD-10-CM | POA: Insufficient documentation

## 2019-06-23 HISTORY — DX: Unspecified convulsions: R56.9

## 2019-06-23 LAB — CBC WITH DIFFERENTIAL/PLATELET
Abs Immature Granulocytes: 0.01 10*3/uL (ref 0.00–0.07)
Basophils Absolute: 0 10*3/uL (ref 0.0–0.1)
Basophils Relative: 0 %
Eosinophils Absolute: 0.3 10*3/uL (ref 0.0–0.5)
Eosinophils Relative: 5 %
HCT: 38 % (ref 36.0–46.0)
Hemoglobin: 11.8 g/dL — ABNORMAL LOW (ref 12.0–15.0)
Immature Granulocytes: 0 %
Lymphocytes Relative: 37 %
Lymphs Abs: 2.5 10*3/uL (ref 0.7–4.0)
MCH: 29.9 pg (ref 26.0–34.0)
MCHC: 31.1 g/dL (ref 30.0–36.0)
MCV: 96.2 fL (ref 80.0–100.0)
Monocytes Absolute: 0.4 10*3/uL (ref 0.1–1.0)
Monocytes Relative: 6 %
Neutro Abs: 3.5 10*3/uL (ref 1.7–7.7)
Neutrophils Relative %: 52 %
Platelets: 271 10*3/uL (ref 150–400)
RBC: 3.95 MIL/uL (ref 3.87–5.11)
RDW: 14.2 % (ref 11.5–15.5)
WBC: 6.8 10*3/uL (ref 4.0–10.5)
nRBC: 0 % (ref 0.0–0.2)

## 2019-06-23 LAB — BASIC METABOLIC PANEL
Anion gap: 7 (ref 5–15)
BUN: 11 mg/dL (ref 6–20)
CO2: 24 mmol/L (ref 22–32)
Calcium: 8.9 mg/dL (ref 8.9–10.3)
Chloride: 110 mmol/L (ref 98–111)
Creatinine, Ser: 0.69 mg/dL (ref 0.44–1.00)
GFR calc Af Amer: >60 mL/min (ref >60)
GFR calc non Af Amer: >60 mL/min (ref >60)
Glucose, Bld: 90 mg/dL (ref 70–99)
Potassium: 4.3 mmol/L (ref 3.5–5.1)
Sodium: 141 mmol/L (ref 135–145)

## 2019-06-23 LAB — PREGNANCY, URINE: Preg Test, Ur: NEGATIVE

## 2019-06-23 MED ORDER — LEVETIRACETAM 500 MG PO TABS: 500.0000 mg | ORAL_TABLET | Freq: Two times a day (BID) | ORAL | 2 refills | Status: DC

## 2019-06-23 NOTE — ED Triage Notes (Signed)
She states she had a seizure last night. She woke up on the ground. She states she hit the front part of her head and bit her tongue. Has had a seizure in the past but does not take medication.

## 2019-06-23 NOTE — ED Notes (Signed)
Patient transported to CT 

## 2019-06-23 NOTE — ED Notes (Signed)
ED Provider at bedside. 

## 2019-06-23 NOTE — Discharge Instructions (Addendum)
You were seen in the emergency department for evaluation of a probable seizure last night.  You had blood work and a CAT scan that did not show any serious findings.  We are restarting your Keppra and making a referral for you to neurology.  Please do not drive or operate other heavy machinery until you have been cleared by neurology.  Return to the emergency department if any worsening symptoms.

## 2019-06-23 NOTE — ED Provider Notes (Signed)
[facility] EMERGENCY DEPARTMENT Provider Note   CSN: [id number] Arrival date & time: 06/23/19  0934     History   Chief Complaint Chief Complaint  Patient presents with  . Seizures    HPI Jamie Archer is a 28 y.o. female.  She said she had a moderate headache all day yesterday and has had some nausea.  Last evening she was sitting on her bed and then woke up on the floor having bitten her tongue.  She thinks she probably had a seizure.  There was no incontinence of urine.  She is not on seizure medications but has had seizures before.  She says her last one was about a year ago.  She denies any recent illness symptoms.  Her headache is improved.     The history is provided by the patient.  Seizures Seizure activity on arrival: no   Seizure type:  Unable to specify Preceding symptoms: headache and nausea   Initial focality:  Unable to specify Episode characteristics: tongue biting   Return to baseline: yes   Severity:  Unable to specify Timing:  Once Number of seizures this episode:  1 Progression:  Resolved Context: not alcohol withdrawal, not fever and not pregnant   Recent head injury:  No recent head injuries PTA treatment:  None History of seizures: yes     Past Medical History:  Diagnosis Date  . Acid reflux   . Migraine   . Pneumonia   . Seizures [facility](HCC)     Patient Active Problem List   Diagnosis Date Noted  . Migraine 10/26/2013    History reviewed. No pertinent surgical history.   OB History   No obstetric history on file.      Home Medications    Prior to Admission medications   Not on File    Family History No family history on file.  Social History Social History   Tobacco Use  . Smoking status: Current Every Day Smoker    Packs/day: 0.50    Types: Cigarettes  . Smokeless tobacco: Never Used  Substance Use Topics  . Alcohol use: Yes    Comment: occassionally  . Drug use: No     Allergies   Zithromax  [azithromycin] and Penicillins   Review of Systems Review of Systems  Constitutional: Negative for fever.  HENT: Negative for sore throat.   Eyes: Negative for visual disturbance.  Respiratory: Negative for shortness of breath.   Cardiovascular: Negative for chest pain.  Gastrointestinal: Negative for abdominal pain.  Genitourinary: Negative for dysuria.  Musculoskeletal: Negative for neck pain.  Skin: Negative for rash.  Neurological: Positive for seizures and headaches.     Physical Exam Updated Vital Signs BP 129/74 (BP Location: Right Arm)   Pulse 80   Temp 98.5 F (36.9 C) (Oral)   Resp 18   Ht 5\' 4"  (1.626 m)   Wt 124.7 kg   LMP 06/13/2019   SpO2 99%   BMI 47.20 kg/m   Physical Exam Vitals signs and nursing note reviewed.  Constitutional:      General: She is not in acute distress.    Appearance: She is well-developed. She is obese.  HENT:     Head: Normocephalic and atraumatic.     Mouth/Throat:     Comments: Tongue with minimal signs of trauma.  No open wounds. Eyes:     Conjunctiva/sclera: Conjunctivae normal.  Neck:     Musculoskeletal: Neck supple.  Cardiovascular:     Rate and  Rhythm: Normal rate and regular rhythm.     Heart sounds: No murmur.  Pulmonary:     Effort: Pulmonary effort is normal. No respiratory distress.     Breath sounds: Normal breath sounds.  Abdominal:     Palpations: Abdomen is soft.     Tenderness: There is no abdominal tenderness.  Musculoskeletal: Normal range of motion.        General: No deformity or signs of injury.  Skin:    General: Skin is warm and dry.     Capillary Refill: Capillary refill takes less than 2 seconds.  Neurological:     General: No focal deficit present.     Mental Status: She is alert and oriented to person, place, and time.     Cranial Nerves: No cranial nerve deficit.     Sensory: No sensory deficit.     Motor: No weakness.     Gait: Gait normal.      ED Treatments / Results  Labs  (all labs ordered are listed, but only abnormal results are displayed) Labs Reviewed  CBC WITH DIFFERENTIAL/PLATELET - Abnormal; Notable for the following components:      Result Value   Hemoglobin 11.8 (*)    All other components within normal limits  BASIC METABOLIC PANEL  PREGNANCY, URINE    EKG None  Radiology Ct Head Wo Contrast  Result Date: 06/23/2019 CLINICAL DATA:  Post seizure last night hitting anterior right forehead with resultant headache and dizziness. EXAM: CT HEAD WITHOUT CONTRAST TECHNIQUE: Contiguous axial images were obtained from the base of the skull through the vertex without intravenous contrast. COMPARISON:  04/25/2017 FINDINGS: Brain: Ventricles, cisterns and other CSF spaces are normal. There is no mass, mass effect, shift of midline structures or acute hemorrhage. No evidence of acute infarction. Vascular: No hyperdense vessel or unexpected calcification. Skull: Normal. Negative for fracture or focal lesion. Sinuses/Orbits: Orbits and sinuses are within normal. Other: None. IMPRESSION: No acute findings. Electronically Signed   By: Marin Olp M.D.   On: 06/23/2019 10:32    Procedures Procedures (including critical care time)  Medications Ordered in ED Medications - No data to display   Initial Impression / Assessment and Plan / ED Course  I have reviewed the triage vital signs and the nursing notes.  Pertinent labs & imaging results that were available during my care of the patient were reviewed by me and considered in my medical decision making (see chart for details).  Clinical Course as of Jun 23 1631  Sat Jun 22, 1965  7919 28 year old female here after headache and nausea yesterday and probable seizure episode.  Differential diagnosis includes seizures, metabolic derangement, intracranial mass, bleed.   [MB]  8921 Patient's lab work pregnancy test and CT head unremarkable.  Will refer her on to neurology and start her on some antiepileptic.    [MB]    Clinical Course User Index [MB] Hayden Rasmussen, MD        Final Clinical Impressions(s) / ED Diagnoses   Final diagnoses:  Seizure [facility])    ED Discharge Orders         Ordered    Ambulatory referral to Neurology    Comments: An appointment is requested in approximately: 4 weeks   06/23/19 1057    levETIRAcetam (KEPPRA) 500 MG tablet  2 times daily     06/23/19 1057           Hayden Rasmussen, MD 06/23/19 [phone]

## 2019-06-30 ENCOUNTER — Emergency Department (HOSPITAL_BASED_OUTPATIENT_CLINIC_OR_DEPARTMENT_OTHER): Payer: Medicaid Other

## 2019-06-30 ENCOUNTER — Emergency Department (HOSPITAL_BASED_OUTPATIENT_CLINIC_OR_DEPARTMENT_OTHER)
Admission: EM | Admit: 2019-06-30 | Discharge: 2019-06-30 | Disposition: A | Discharge status: Home / Self Care | Payer: Medicaid Other | Attending: Emergency Medicine | Admitting: Emergency Medicine

## 2019-06-30 ENCOUNTER — Other Ambulatory Visit: Payer: Self-pay

## 2019-06-30 ENCOUNTER — Encounter (HOSPITAL_BASED_OUTPATIENT_CLINIC_OR_DEPARTMENT_OTHER): Payer: Self-pay | Admitting: Emergency Medicine

## 2019-06-30 DIAGNOSIS — S0081XA Abrasion of other part of head, initial encounter: Secondary | ICD-10-CM | POA: Insufficient documentation

## 2019-06-30 DIAGNOSIS — R51 Headache: Secondary | ICD-10-CM | POA: Insufficient documentation

## 2019-06-30 DIAGNOSIS — G4089 Other seizures: Secondary | ICD-10-CM | POA: Insufficient documentation

## 2019-06-30 DIAGNOSIS — Y939 Activity, unspecified: Secondary | ICD-10-CM | POA: Insufficient documentation

## 2019-06-30 DIAGNOSIS — Y929 Unspecified place or not applicable: Secondary | ICD-10-CM | POA: Insufficient documentation

## 2019-06-30 DIAGNOSIS — Z880 Allergy status to penicillin: Secondary | ICD-10-CM | POA: Insufficient documentation

## 2019-06-30 DIAGNOSIS — F17210 Nicotine dependence, cigarettes, uncomplicated: Secondary | ICD-10-CM | POA: Insufficient documentation

## 2019-06-30 DIAGNOSIS — S0012XA Contusion of left eyelid and periocular area, initial encounter: Secondary | ICD-10-CM | POA: Insufficient documentation

## 2019-06-30 DIAGNOSIS — W19XXXA Unspecified fall, initial encounter: Secondary | ICD-10-CM | POA: Insufficient documentation

## 2019-06-30 DIAGNOSIS — R569 Unspecified convulsions: Secondary | ICD-10-CM

## 2019-06-30 DIAGNOSIS — Y999 Unspecified external cause status: Secondary | ICD-10-CM | POA: Insufficient documentation

## 2019-06-30 DIAGNOSIS — H1132 Conjunctival hemorrhage, left eye: Secondary | ICD-10-CM

## 2019-06-30 DIAGNOSIS — Z79899 Other long term (current) drug therapy: Secondary | ICD-10-CM | POA: Insufficient documentation

## 2019-06-30 DIAGNOSIS — Z881 Allergy status to other antibiotic agents status: Secondary | ICD-10-CM | POA: Insufficient documentation

## 2019-06-30 LAB — PREGNANCY, URINE: Preg Test, Ur: NEGATIVE

## 2019-06-30 LAB — CBC WITH DIFFERENTIAL/PLATELET
Abs Immature Granulocytes: 0.01 10*3/uL (ref 0.00–0.07)
Basophils Absolute: 0 10*3/uL (ref 0.0–0.1)
Basophils Relative: 0 %
Eosinophils Absolute: 0.2 10*3/uL (ref 0.0–0.5)
Eosinophils Relative: 3 %
HCT: 40.9 % (ref 36.0–46.0)
Hemoglobin: 12.9 g/dL (ref 12.0–15.0)
Immature Granulocytes: 0 %
Lymphocytes Relative: 30 %
Lymphs Abs: 2.3 10*3/uL (ref 0.7–4.0)
MCH: 30.4 pg (ref 26.0–34.0)
MCHC: 31.5 g/dL (ref 30.0–36.0)
MCV: 96.2 fL (ref 80.0–100.0)
Monocytes Absolute: 0.5 10*3/uL (ref 0.1–1.0)
Monocytes Relative: 7 %
Neutro Abs: 4.4 10*3/uL (ref 1.7–7.7)
Neutrophils Relative %: 60 %
Platelets: 291 10*3/uL (ref 150–400)
RBC: 4.25 MIL/uL (ref 3.87–5.11)
RDW: 14.2 % (ref 11.5–15.5)
WBC: 7.4 10*3/uL (ref 4.0–10.5)
nRBC: 0 % (ref 0.0–0.2)

## 2019-06-30 LAB — BASIC METABOLIC PANEL
Anion gap: 10 (ref 5–15)
BUN: 10 mg/dL (ref 6–20)
CO2: 23 mmol/L (ref 22–32)
Calcium: 9.2 mg/dL (ref 8.9–10.3)
Chloride: 107 mmol/L (ref 98–111)
Creatinine, Ser: 0.77 mg/dL (ref 0.44–1.00)
GFR calc Af Amer: >60 mL/min (ref >60)
GFR calc non Af Amer: >60 mL/min (ref >60)
Glucose, Bld: 107 mg/dL — ABNORMAL HIGH (ref 70–99)
Potassium: 4 mmol/L (ref 3.5–5.1)
Sodium: 140 mmol/L (ref 135–145)

## 2019-06-30 MED ORDER — LAMOTRIGINE 25 MG PO TABS: 25.0000 mg | ORAL_TABLET | Freq: Every day | ORAL | 0 refills | Status: DC

## 2019-06-30 MED ORDER — FLUORESCEIN SODIUM 1 MG OP STRP
1.0000 | ORAL_STRIP | Freq: Once | OPHTHALMIC | Status: AC
  Administered 2019-06-30: 1 via OPHTHALMIC
  Filled 2019-06-30: qty 1

## 2019-06-30 NOTE — ED Provider Notes (Signed)
[facility] EMERGENCY DEPARTMENT Provider Note   CSN: [id number] Arrival date & time: 06/30/19  8937     History   Chief Complaint Chief Complaint  Patient presents with   Seizures   Facial Laceration    HPI Jamie Archer is a 28 y.o. female.  Presents the emergency department after patient had a seizure last night.  Reports that does not remember exactly what happened, woke up this morning after she had fallen and hit her left face.  States that she is concerned that she may have had a seizure episode.  No one was with her last night.  No bladder or bowel incontinence.  Denies any numbness, weakness, tingling at present.  States that she normally wears glasses but does not have these with her at present.  States since starting the Keppra she has had bad moods.     HPI  Past Medical History:  Diagnosis Date   Acid reflux    Migraine    Pneumonia    Seizures [facility])     Patient Active Problem List   Diagnosis Date Noted   Migraine 10/26/2013    History reviewed. No pertinent surgical history.   OB History   No obstetric history on file.      Home Medications    Prior to Admission medications   Medication Sig Start Date End Date Taking? Authorizing Provider  levETIRAcetam (KEPPRA) 500 MG tablet Take 1 tablet (500 mg total) by mouth 2 (two) times daily. 06/23/19  Yes Terrilee Files, MD  lamoTRIgine (LAMICTAL) 25 MG tablet Take 1 tablet (25 mg total) by mouth daily. 06/30/19   Milagros Loll, MD    Family History History reviewed. No pertinent family history.  Social History Social History   Tobacco Use   Smoking status: Current Every Day Smoker    Packs/day: 0.50    Types: Cigarettes   Smokeless tobacco: Never Used  Substance Use Topics   Alcohol use: Yes    Comment: occassionally   Drug use: No     Allergies   Zithromax [azithromycin] and Penicillins   Review of Systems Review of Systems  Constitutional: Negative for  chills and fever.  HENT: Negative for ear pain and sore throat.   Eyes: Negative for pain and visual disturbance.  Respiratory: Negative for cough and shortness of breath.   Cardiovascular: Negative for chest pain and palpitations.  Gastrointestinal: Negative for abdominal pain and vomiting.  Genitourinary: Negative for dysuria and hematuria.  Musculoskeletal: Negative for arthralgias and back pain.  Skin: Negative for color change and rash.  Neurological: Positive for seizures and headaches. Negative for syncope.  All other systems reviewed and are negative.    Physical Exam Updated Vital Signs BP 139/90    Pulse (!) 48    Temp 98 F (36.7 C)    Resp 18    LMP 06/13/2019    SpO2 100%   Physical Exam Vitals signs and nursing note reviewed.  Constitutional:      General: She is not in acute distress.    Appearance: She is well-developed.  HENT:     Head: Normocephalic.     Comments: There is some ecchymosis over left orbit, superficial abrasion over left eyelid Eyes:     Comments: Subconjunctival hemorrhage present on left, no hyphema, normal pupils bilaterally, equal and reactive, normal extraocular movement without pain bilaterally No abrasion or abnormality noted on fluorescein exam  Neck:     Musculoskeletal: Neck supple.  Cardiovascular:  Rate and Rhythm: Normal rate and regular rhythm.     Heart sounds: No murmur.  Pulmonary:     Effort: Pulmonary effort is normal. No respiratory distress.     Breath sounds: Normal breath sounds.  Abdominal:     Palpations: Abdomen is soft.     Tenderness: There is no abdominal tenderness.  Skin:    General: Skin is warm and dry.     Capillary Refill: Capillary refill takes less than 2 seconds.  Neurological:     General: No focal deficit present.     Mental Status: She is alert and oriented to person, place, and time.     Cranial Nerves: No cranial nerve deficit.     Sensory: No sensory deficit.     Coordination: Coordination  normal.     Gait: Gait normal.     Comments: 5 out 5 strength in bilateral upper and lower extremities      ED Treatments / Results  Labs (all labs ordered are listed, but only abnormal results are displayed) Labs Reviewed  BASIC METABOLIC PANEL - Abnormal; Notable for the following components:      Result Value   Glucose, Bld 107 (*)    All other components within normal limits  CBC WITH DIFFERENTIAL/PLATELET  PREGNANCY, URINE  LEVETIRACETAM LEVEL    EKG EKG Interpretation  Date/Time:  Saturday June 30 2019 09:21:36 EDT Ventricular Rate:  45 PR Interval:    QRS Duration: 78 QT Interval:  448 QTC Calculation: 388 R Axis:   90 Text Interpretation:  Sinus bradycardia Borderline right axis deviation No STEMI Confirmed by Marianna Fussykstra, Josef Tourigny ([id number]) on 06/30/2019 9:57:39 AM   Radiology Ct Head Wo Contrast  Result Date: 06/30/2019 CLINICAL DATA:  Seizure, eyelid laceration, swelling and bruising EXAM: CT HEAD AND ORBITS WITHOUT CONTRAST TECHNIQUE: Contiguous axial images were obtained from the base of the skull through the vertex without contrast. Multidetector CT imaging of the orbits was performed using the standard protocol without intravenous contrast. COMPARISON:  06/23/2019 FINDINGS: CT HEAD FINDINGS Brain: No evidence of acute infarction, hemorrhage, hydrocephalus, extra-axial collection or mass lesion/mass effect. Vascular: No hyperdense vessel or unexpected calcification. Skull: Normal. Negative for fracture or focal lesion. Other: None. CT ORBITS FINDINGS Orbits: No traumatic or inflammatory finding. Globes, optic nerves, orbital fat, extraocular muscles, vascular structures, and lacrimal glands are normal. Visualized sinuses: Clear. Soft tissues: Superficial soft tissue contusion overlying the left orbit, eyelids, and cheek. IMPRESSION: 1.  No acute intracranial pathology. 2. No fracture or other acute noncontrast CT pathology of the orbits. 3. Superficial soft tissue  contusion overlying the left orbit, eyelids, and cheek. Electronically Signed   By: Lauralyn PrimesAlex  Bibbey M.D.   On: 06/30/2019 09:58   Ct Orbits Wo Contrast  Result Date: 06/30/2019 CLINICAL DATA:  Seizure, eyelid laceration, swelling and bruising EXAM: CT HEAD AND ORBITS WITHOUT CONTRAST TECHNIQUE: Contiguous axial images were obtained from the base of the skull through the vertex without contrast. Multidetector CT imaging of the orbits was performed using the standard protocol without intravenous contrast. COMPARISON:  06/23/2019 FINDINGS: CT HEAD FINDINGS Brain: No evidence of acute infarction, hemorrhage, hydrocephalus, extra-axial collection or mass lesion/mass effect. Vascular: No hyperdense vessel or unexpected calcification. Skull: Normal. Negative for fracture or focal lesion. Other: None. CT ORBITS FINDINGS Orbits: No traumatic or inflammatory finding. Globes, optic nerves, orbital fat, extraocular muscles, vascular structures, and lacrimal glands are normal. Visualized sinuses: Clear. Soft tissues: Superficial soft tissue contusion overlying the left orbit, eyelids, and  cheek. IMPRESSION: 1.  No acute intracranial pathology. 2. No fracture or other acute noncontrast CT pathology of the orbits. 3. Superficial soft tissue contusion overlying the left orbit, eyelids, and cheek. Electronically Signed   By: Eddie Candle M.D.   On: 06/30/2019 09:58    Procedures Procedures (including critical care time)  Medications Ordered in ED Medications  fluorescein ophthalmic strip 1 strip (1 strip Left Eye Given by Other 06/30/19 4166)     Initial Impression / Assessment and Plan / ED Course  I have reviewed the triage vital signs and the nursing notes.  Pertinent labs & imaging results that were available during my care of the patient were reviewed by me and considered in my medical decision making (see chart for details).        28 year old presents to ER after fall, possible seizure episode.  Regarding  seizure, had possible seizure episode 1 week ago and had been taking newly prescribed Keppra, 3.  Possible seizure episode last night.  No additional seizure activity today.  Neuro intact.  Given mood side effects from Keppra, will switch to lamotrigine.  Strongly recommended following up with neurology as they were previously recommended by prior ER provider.  Regarding trauma work-up, CT head negative, CT orbit negative.  No hyphema but did have subconjunctival hemorrhage. Normal reactive pupils, normal exam on fluoroscein.  She initial reported that she had decreased vision, but on further clarification she said her vision was intact just took more time to focus than normal; 20/30 on left, 20/25 on right without her regular corrective glasses.  Reviewed strict return precautions. Recommended recheck with PCP and neuro.    After the discussed management above, the patient was determined to be safe for discharge.  The patient was in agreement with this plan and all questions regarding their care were answered.  ED return precautions were discussed and the patient will return to the ED with any significant worsening of condition.    Final Clinical Impressions(s) / ED Diagnoses   Final diagnoses:  Seizures ([place])  Subconjunctival hemorrhage, traumatic, left    ED Discharge Orders         Ordered    lamoTRIgine (LAMICTAL) 25 MG tablet  Daily     06/30/19 1012           Lucrezia Starch, MD 06/30/19 1521

## 2019-06-30 NOTE — Discharge Instructions (Addendum)
Please start the medication as prescribed for seizures, recommend stopping the Keppra given the side effects you are having.  If you develop any sort of rash after starting this medicine, please stop taking the medicine and return to ER.  Please call the neurology number as discussed for close neurology follow-up.  Also recommend following up with your primary care doctor, if you do not have one, you can follow-up with [facility] in the meantime.  If you have any further seizure episodes, have ANY change in vision in your left eye, worsening swelling or other new concerning symptom recommend returning to ER for reassessment.  Recommend you do not drive or operate heavy machinery until cleared by a primary physician or neurologist given the seizure episode.

## 2019-06-30 NOTE — ED Triage Notes (Signed)
Pt states the meds she has for seizures are not effective and had a seizure last night and hit her left eye causing an eyelid laceration.

## 2019-07-02 LAB — LEVETIRACETAM LEVEL: Levetiracetam Lvl: 7.7 ug/mL — ABNORMAL LOW (ref 10.0–40.0)

## 2019-07-11 ENCOUNTER — Ambulatory Visit (INDEPENDENT_AMBULATORY_CARE_PROVIDER_SITE_OTHER): Payer: Self-pay | Admitting: Neurology

## 2019-07-11 ENCOUNTER — Encounter: Payer: Self-pay | Admitting: Neurology

## 2019-07-11 ENCOUNTER — Other Ambulatory Visit: Payer: Self-pay

## 2019-07-11 ENCOUNTER — Telehealth: Payer: Self-pay | Admitting: Neurology

## 2019-07-11 VITALS — BP 133/83 | HR 65 | Ht 66.0 in | Wt 299.0 lb

## 2019-07-11 DIAGNOSIS — G43009 Migraine without aura, not intractable, without status migrainosus: Secondary | ICD-10-CM

## 2019-07-11 DIAGNOSIS — G40009 Localization-related (focal) (partial) idiopathic epilepsy and epileptic syndromes with seizures of localized onset, not intractable, without status epilepticus: Secondary | ICD-10-CM

## 2019-07-11 MED ORDER — TOPIRAMATE 25 MG PO TABS: ORAL_TABLET | ORAL | 5 refills | Status: DC

## 2019-07-11 NOTE — Telephone Encounter (Signed)
Patient left msg with after hours about RX topamax can be moved to another. (not sure if she means another pharm or another medication). Thanks!
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
## 2019-07-11 NOTE — Patient Instructions (Signed)
1. Start Topamax 25mg : take 1 tablet twice a day for 1 week, then increase to 2 tablets twice a day. Call our office for an update on how you are feeling in 2-3 weeks  2. Call our office once you have the Financial Assistance so we go ahead and order tests: MRI brain and EEG  3. As per [place] driving laws, no driving until 6 months seizure-free  4. Follow-up in 3 months, call for any changes  Seizure Precautions: 1. If medication has been prescribed for you to prevent seizures, take it exactly as directed.  Do not stop taking the medicine without talking to your doctor first, even if you have not had a seizure in a long time.   2. Avoid activities in which a seizure would cause danger to yourself or to others.  Don't operate dangerous machinery, swim alone, or climb in high or dangerous places, such as on ladders, roofs, or girders.  Do not drive unless your doctor says you may.  3. If you have any warning that you may have a seizure, lay down in a safe place where you can't hurt yourself.    4.  No driving for 6 months from last seizure, as per [facility].   Please refer to the following link on the [facility] website for more information: http://www.epilepsyfoundation.org/answerplace/Social/driving/drivingu.cfm   5.  Maintain good sleep hygiene. Avoid alcohol.  6.  Notify your neurology if you are planning pregnancy or if you become pregnant.  7.  Contact your doctor if you have any problems that may be related to the medicine you are taking.  8.  Call 911 and bring the patient back to the ED if:        A.  The seizure lasts longer than 5 minutes.       B.  The patient doesn't awaken shortly after the seizure  C.  The patient has new problems such as difficulty seeing, speaking or moving  D.  The patient was injured during the seizure  E.  The patient has a temperature over 102 F (39C)  F.  The patient vomited and now is having trouble breathing

## 2019-07-12 NOTE — Telephone Encounter (Signed)
Called patient back in regards to her question about her topamax script. She states she is all set and doesn't need anything from [place] - no further questions.

## 2019-07-13 ENCOUNTER — Telehealth: Payer: Self-pay | Admitting: Neurology

## 2019-07-13 NOTE — Telephone Encounter (Signed)
Patient called regarding paperwork that she had given to Dr. Delice Lesch to fill out. She said she spoke with [employer] and they are needing the forms returned by 07/19/19. Thanks

## 2019-07-26 ENCOUNTER — Telehealth: Payer: Self-pay | Admitting: Neurology

## 2019-07-26 NOTE — Telephone Encounter (Signed)
Patient wants to speak to someone about her medication topamax  She states that Dr Delice Lesch had her to increase the dosage but she has some questions

## 2019-07-26 NOTE — Telephone Encounter (Signed)
Pt instructed that she should be taking 2 tablets twice daily of her 25mg  Topamax. She states that she is taking 100 daily.

## 2019-07-26 NOTE — Telephone Encounter (Signed)
Left message for pt to return call.

## 2019-08-02 ENCOUNTER — Telehealth: Payer: Self-pay | Admitting: Neurology

## 2019-08-02 NOTE — Telephone Encounter (Signed)
Patient is calling in that she lost her sense of speaking and being able to form sentences over the last week -week and a half. She is now starting to get better but was wanting to speak with a nurse about this. She started a new medication. Please call her back. She didn't know if she needed an appt or what would be best. Thanks!

## 2019-08-02 NOTE — Telephone Encounter (Signed)
Called patient and made her aware of MD note below. Verbalized understanding.

## 2019-08-02 NOTE — Telephone Encounter (Signed)
Pls let her know that it would be unusual for Topamax side effect to be transient and go away, usually it would be more of an ongoing thing. I wonder if that was actually a small seizure. And if that is the case, would continue on the same dose of Topamax for now. Call for any changes. Thanks

## 2019-08-02 NOTE — Telephone Encounter (Signed)
Office visit 9/30 patient was started on topamax 25mg  BID for one week. She increased it per order two weeks ago to 2 tabs BID.  Patient stated today (Thurs) that Monday am she couldn't form sentences and words / speech was not coming out right. All day Monday and Tuesday was like this. NO other symptoms - no numbness/drooping side of face/etc. She stated yesterday started to get better and now today she is fine and able to talk normally. States NO other changes she can think of other than this topamax which she has now been on 3 weeks. Has continued to take it as ordered.  Reviewed s/s of stroke with patient and told her if she has a situation with concern of one to go to ER. Also regarding not being able to speak/form sentences for two days to please alert [place] if that happens again (instead of 2-3 days afterwards) so we can assess her. Verbalizes understanding.  Said she looked up side effects of topamax and it did say speech problems. She states all is well now but wanted Dr. Delice Lesch to know and ask if she thinks it was from the meds. Informed patient will pass on to MD and call her back.

## 2019-08-15 ENCOUNTER — Encounter (HOSPITAL_BASED_OUTPATIENT_CLINIC_OR_DEPARTMENT_OTHER): Payer: Self-pay | Admitting: Emergency Medicine

## 2019-08-15 ENCOUNTER — Emergency Department (HOSPITAL_BASED_OUTPATIENT_CLINIC_OR_DEPARTMENT_OTHER): Payer: Self-pay

## 2019-08-15 ENCOUNTER — Other Ambulatory Visit: Payer: Self-pay

## 2019-08-15 ENCOUNTER — Emergency Department (HOSPITAL_BASED_OUTPATIENT_CLINIC_OR_DEPARTMENT_OTHER)
Admission: EM | Admit: 2019-08-15 | Discharge: 2019-08-15 | Disposition: A | Discharge status: Home / Self Care | Payer: Medicaid Other | Attending: Emergency Medicine | Admitting: Emergency Medicine

## 2019-08-15 DIAGNOSIS — Z79899 Other long term (current) drug therapy: Secondary | ICD-10-CM | POA: Insufficient documentation

## 2019-08-15 DIAGNOSIS — Z3202 Encounter for pregnancy test, result negative: Secondary | ICD-10-CM | POA: Insufficient documentation

## 2019-08-15 DIAGNOSIS — S0990XA Unspecified injury of head, initial encounter: Secondary | ICD-10-CM

## 2019-08-15 DIAGNOSIS — M25511 Pain in right shoulder: Secondary | ICD-10-CM | POA: Insufficient documentation

## 2019-08-15 DIAGNOSIS — F17210 Nicotine dependence, cigarettes, uncomplicated: Secondary | ICD-10-CM | POA: Insufficient documentation

## 2019-08-15 DIAGNOSIS — W06XXXA Fall from bed, initial encounter: Secondary | ICD-10-CM | POA: Insufficient documentation

## 2019-08-15 DIAGNOSIS — Y999 Unspecified external cause status: Secondary | ICD-10-CM | POA: Insufficient documentation

## 2019-08-15 DIAGNOSIS — Y92003 Bedroom of unspecified non-institutional (private) residence as the place of occurrence of the external cause: Secondary | ICD-10-CM | POA: Insufficient documentation

## 2019-08-15 DIAGNOSIS — Y9384 Activity, sleeping: Secondary | ICD-10-CM | POA: Insufficient documentation

## 2019-08-15 LAB — BASIC METABOLIC PANEL
Anion gap: 6 (ref 5–15)
BUN: 6 mg/dL (ref 6–20)
CO2: 19 mmol/L — ABNORMAL LOW (ref 22–32)
Calcium: 9 mg/dL (ref 8.9–10.3)
Chloride: 114 mmol/L — ABNORMAL HIGH (ref 98–111)
Creatinine, Ser: 0.99 mg/dL (ref 0.44–1.00)
GFR calc Af Amer: >60 mL/min (ref >60)
GFR calc non Af Amer: >60 mL/min (ref >60)
Glucose, Bld: 89 mg/dL (ref 70–99)
Potassium: 3.7 mmol/L (ref 3.5–5.1)
Sodium: 139 mmol/L (ref 135–145)

## 2019-08-15 LAB — CBC WITH DIFFERENTIAL/PLATELET
Abs Immature Granulocytes: 0.01 10*3/uL (ref 0.00–0.07)
Basophils Absolute: 0 10*3/uL (ref 0.0–0.1)
Basophils Relative: 1 %
Eosinophils Absolute: 0.2 10*3/uL (ref 0.0–0.5)
Eosinophils Relative: 3 %
HCT: 39.4 % (ref 36.0–46.0)
Hemoglobin: 12.5 g/dL (ref 12.0–15.0)
Immature Granulocytes: 0 %
Lymphocytes Relative: 39 %
Lymphs Abs: 2.2 10*3/uL (ref 0.7–4.0)
MCH: 30.5 pg (ref 26.0–34.0)
MCHC: 31.7 g/dL (ref 30.0–36.0)
MCV: 96.1 fL (ref 80.0–100.0)
Monocytes Absolute: 0.4 10*3/uL (ref 0.1–1.0)
Monocytes Relative: 7 %
Neutro Abs: 2.9 10*3/uL (ref 1.7–7.7)
Neutrophils Relative %: 50 %
Platelets: 254 10*3/uL (ref 150–400)
RBC: 4.1 MIL/uL (ref 3.87–5.11)
RDW: 14.2 % (ref 11.5–15.5)
WBC: 5.8 10*3/uL (ref 4.0–10.5)
nRBC: 0 % (ref 0.0–0.2)

## 2019-08-15 LAB — URINALYSIS, ROUTINE W REFLEX MICROSCOPIC
Bilirubin Urine: NEGATIVE
Glucose, UA: NEGATIVE mg/dL
Hgb urine dipstick: NEGATIVE
Ketones, ur: NEGATIVE mg/dL
Leukocytes,Ua: NEGATIVE
Nitrite: NEGATIVE
Protein, ur: NEGATIVE mg/dL
Specific Gravity, Urine: 1.025 (ref 1.005–1.030)
pH: 6 (ref 5.0–8.0)

## 2019-08-15 LAB — PREGNANCY, URINE: Preg Test, Ur: NEGATIVE

## 2019-08-15 MED ORDER — ACETAMINOPHEN 325 MG PO TABS
650.0000 mg | ORAL_TABLET | Freq: Once | ORAL | Status: AC
  Administered 2019-08-15: 650 mg via ORAL
  Filled 2019-08-15: qty 2

## 2019-08-15 NOTE — ED Notes (Signed)
IV attempted x2 without success.

## 2019-08-15 NOTE — ED Provider Notes (Signed)
[facility] EMERGENCY DEPARTMENT Provider Note   CSN: [id number] Arrival date & time: 08/15/19  0950     History   Chief Complaint Chief Complaint  Patient presents with  . Fall  . possible seizure    HPI [name] is a 28 y.o. female with a hx of seizures on topamax, migraines, & GERD presents to the ED with concern for possible seizure last night. Patient went to bed feeling @ baseline & woke up this AM on the floor w/ a headache. She thinks she fell out of bed & struck her head as it hurts on the R posterior aspect, also is having some discomfort to the R upper arm. She does not recall falling at all and is concerned she may have had a seizure leading to her being on the floor but she is not sure. Other than mild headache/RUE discomfort & some nausea otherwise feels at baseline. No alleviating/aggravating factors. Denies fever, URI sxs, visual disturbance, numbness, weakness, dizziness, chest pain, or abdominal pain. She has not missed any recent doses of her Topamax. Last seizure was 2 months ago. She called her neurology office and left a message for them regarding event.     HPI  Past Medical History:  Diagnosis Date  . Acid reflux   . Migraine   . Pneumonia   . Seizures [facility])     Patient Active Problem List   Diagnosis Date Noted  . Migraine 10/26/2013    History reviewed. No pertinent surgical history.   OB History   No obstetric history on file.      Home Medications    Prior to Admission medications   Medication Sig Start Date End Date Taking? Authorizing Provider  topiramate (TOPAMAX) 25 MG tablet Take 1 tablet twice a day for 1 week, then increase to 2 tablets twice a day 07/11/19   Van Clines, MD    Family History Family History  Problem Relation Age of Onset  . Migraines Mother   . Migraines Father   . Migraines Maternal Grandmother     Social History Social History   Tobacco Use  . Smoking status: Current Every Day Smoker     Packs/day: 0.50    Types: Cigarettes  . Smokeless tobacco: Never Used  Substance Use Topics  . Alcohol use: Yes    Comment: occassionally  . Drug use: No     Allergies   Zithromax [azithromycin] and Penicillins   Review of Systems Review of Systems  Constitutional: Negative for chills and fever.  HENT: Negative for congestion and sore throat.   Eyes: Negative for visual disturbance.  Respiratory: Negative for cough and shortness of breath.   Cardiovascular: Negative for chest pain.  Gastrointestinal: Positive for nausea. Negative for constipation, diarrhea and vomiting.  Genitourinary: Negative for dysuria.  Musculoskeletal: Positive for myalgias.  Neurological: Positive for seizures (?) and headaches. Negative for dizziness, facial asymmetry, speech difficulty, weakness and numbness.  All other systems reviewed and are negative.    Physical Exam Updated Vital Signs BP 130/73 (BP Location: Right Arm)   Pulse (!) 51   Temp 98.8 F (37.1 C) (Oral)   Resp 18   Ht 5\' 6"  (1.676 m)   Wt 124.7 kg   LMP 07/16/2019   SpO2 100%   BMI 44.39 kg/m   Physical Exam Vitals signs and nursing note reviewed.  Constitutional:      General: She is not in acute distress.    Appearance: Normal appearance.  She is well-developed. She is not ill-appearing or toxic-appearing.  HENT:     Head: Normocephalic and atraumatic. No raccoon eyes or Battle's sign.     Comments: R parietal scalp tenderness to palpation posteriorly.     Right Ear: No hemotympanum.     Left Ear: No hemotympanum.     Mouth/Throat:     Mouth: Mucous membranes are moist.     Comments: Uvula midline.  Eyes:     General:        Right eye: No discharge.        Left eye: No discharge.     Conjunctiva/sclera: Conjunctivae normal.     Pupils: Pupils are equal, round, and reactive to light.  Neck:     Musculoskeletal: Normal range of motion and neck supple. No neck rigidity, spinous process tenderness or muscular  tenderness.     Comments: No midline tenderness.  Cardiovascular:     Rate and Rhythm: Regular rhythm. Bradycardia present.     Pulses:          Radial pulses are 2+ on the right side and 2+ on the left side.     Heart sounds: No murmur.  Pulmonary:     Effort: No respiratory distress.     Breath sounds: Normal breath sounds. No wheezing or rales.  Abdominal:     General: There is no distension.     Palpations: Abdomen is soft.     Tenderness: There is no abdominal tenderness.  Musculoskeletal:     Comments: Upper extremities: No obvious deformity, appreciable swelling, edema, erythema, ecchymosis, warmth, or open wounds. Patient has intact AROM throughout. Tender to palpation to diffuse R GH joint & proximal 1/3rd of the humerus. Otherwise nontender.  Back: No midline tenderness Lower extremities: Nontender.   Skin:    General: Skin is warm and dry.     Capillary Refill: Capillary refill takes less than 2 seconds.     Findings: No rash.  Neurological:     Mental Status: She is alert.     Comments: Alert. Clear speech. No facial droop. CNIII-XII grossly intact. Bilateral upper and lower extremities' sensation grossly intact. 5/5 symmetric strength with grip strength and with plantar and dorsi flexion bilaterally.Normal finger to nose bilaterally. Negative pronator drift. Gait is steady and intact.  Psychiatric:        Mood and Affect: Mood normal.        Behavior: Behavior normal.      ED Treatments / Results  Labs (all labs ordered are listed, but only abnormal results are displayed) Labs Reviewed  BASIC METABOLIC PANEL - Abnormal; Notable for the following components:      Result Value   Chloride 114 (*)    CO2 19 (*)    All other components within normal limits  CBC WITH DIFFERENTIAL/PLATELET  URINALYSIS, ROUTINE W REFLEX MICROSCOPIC  PREGNANCY, URINE  TOPIRAMATE LEVEL    EKG None  Radiology Dg Shoulder Right  Result Date: 08/15/2019 CLINICAL DATA:  Injury  EXAM: RIGHT SHOULDER - 2+ VIEW COMPARISON:  None. FINDINGS: No fracture or dislocation of the right shoulder. Joint spaces are preserved. Partially imaged right chest is unremarkable. IMPRESSION: No fracture or dislocation of the right shoulder. Joint spaces are preserved. Electronically Signed   By: Eddie Candle M.D.   On: 08/15/2019 11:12   Ct Head Wo Contrast  Result Date: 08/15/2019 CLINICAL DATA:  Headache with questionable seizure. Unwitnessed fall EXAM: CT HEAD WITHOUT CONTRAST TECHNIQUE: Contiguous axial images  were obtained from the base of the skull through the vertex without intravenous contrast. COMPARISON:  June 30, 2019 FINDINGS: Brain: The ventricles are normal in size and configuration. There is no intracranial mass, hemorrhage, extra-axial fluid collection, or midline shift. The brain parenchyma appears unremarkable. No evident acute infarct. Vascular: No hyperdense vessel.  No evident vascular calcification. Skull: The bony calvarium appears intact. Sinuses/Orbits: Visualized paranasal sinuses are clear. Visualized orbits appear symmetric bilaterally. Other: Mastoid air cells are clear. IMPRESSION: Study within normal limits. Electronically Signed   By: Bretta BangWilliam  Woodruff III M.D.   On: 08/15/2019 11:08    Procedures Procedures (including critical care time)  Medications Ordered in ED Medications - No data to display   Initial Impression / Assessment and Plan / ED Course  I have reviewed the triage vital signs and the nursing notes.  Pertinent labs & imaging results that were available during my care of the patient were reviewed by me and considered in my medical decision making (see chart for details).   Patient presents to the ED with complaints of headache & RUE pain after waking up on the floor this morning, she is concerned she possibly had a seizure last night leading to this. She is nontoxic appearing, mildly bradycardic, vitals otherwise WNL. Exam w/ some R parietal  scalp & R shoulder/upper arm tenderness to palpation. No neuro deficits. Plan for basic labs & imaging.   CBC: No anemia/leukocytosis.  BMP: Mildly elevated chloride, no significant electrolyte derangements.  UA: No UTI Preg test: Negative Imaging: No signs of injury or other concerning abnormalities.  Topiramate level: Pending, will likely not result during ER visit anticipate therapeutic as patient has not missed dosing.   Unclear if this was a seizure as event was unwitnessed.  Has appeared hemodynamically stable throughout ER visit with reassuring work-up. Appears appropriate for discharge home with neurology/PCP follow up. I discussed results, treatment plan, need for follow-up, and return precautions with the patient. Provided opportunity for questions, patient confirmed understanding and is in agreement with plan.    Final Clinical Impressions(s) / ED Diagnoses   Final diagnoses:  Injury of head, initial encounter    ED Discharge Orders    None       Cherly Andersonetrucelli, Samantha R, PA-C 08/15/19 1252    Sabas SousBero, Michael M, MD 08/16/19 1500

## 2019-08-15 NOTE — ED Triage Notes (Signed)
Pt states she woke up lying on the floor this morning. C/o R side head pain. Hx of seizures, unsure if she had one. No incontinence.

## 2019-08-15 NOTE — ED Notes (Signed)
Pt verbalized understanding of dc instructions.

## 2019-08-15 NOTE — Discharge Instructions (Addendum)
You were seen in the emergency department today after a possible seizure activity with a head injury.  Your work-up was overall reassuring.  Your labs did not show any substantial abnormalities.  Your imaging did not show any significant injuries.  Please take Tylenol/Motrin per over-the-counter dosing to help with discomfort.  Please follow-up with your neurologist within 3 days.  Return to the ER for new or worsening symptoms including but not limited to seizure activity, vomiting, change in vision, numbness, weakness, increased pain, or any other concerns.

## 2019-08-16 LAB — TOPIRAMATE LEVEL: Topiramate Lvl: 4.2 ug/mL (ref 2.0–25.0)

## 2019-08-31 ENCOUNTER — Ambulatory Visit (INDEPENDENT_AMBULATORY_CARE_PROVIDER_SITE_OTHER): Payer: Self-pay | Admitting: Neurology

## 2019-08-31 ENCOUNTER — Other Ambulatory Visit: Payer: Self-pay

## 2019-08-31 ENCOUNTER — Encounter: Payer: Self-pay | Admitting: Neurology

## 2019-08-31 VITALS — BP 161/85 | HR 67 | Ht 66.0 in | Wt 280.0 lb

## 2019-08-31 DIAGNOSIS — G40009 Localization-related (focal) (partial) idiopathic epilepsy and epileptic syndromes with seizures of localized onset, not intractable, without status epilepticus: Secondary | ICD-10-CM

## 2019-08-31 DIAGNOSIS — G43009 Migraine without aura, not intractable, without status migrainosus: Secondary | ICD-10-CM

## 2019-08-31 MED ORDER — TOPIRAMATE 100 MG PO TABS: 100.0000 mg | ORAL_TABLET | Freq: Two times a day (BID) | ORAL | 11 refills | Status: DC
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
## 2019-09-10 ENCOUNTER — Ambulatory Visit (INDEPENDENT_AMBULATORY_CARE_PROVIDER_SITE_OTHER): Payer: Self-pay | Admitting: Neurology

## 2019-09-10 ENCOUNTER — Other Ambulatory Visit: Payer: Self-pay

## 2019-09-10 DIAGNOSIS — G40009 Localization-related (focal) (partial) idiopathic epilepsy and epileptic syndromes with seizures of localized onset, not intractable, without status epilepticus: Secondary | ICD-10-CM

## 2019-09-10 DIAGNOSIS — G43009 Migraine without aura, not intractable, without status migrainosus: Secondary | ICD-10-CM

## 2019-09-10 NOTE — Procedures (Signed)
ELECTROENCEPHALOGRAM REPORT  Date of Study: 09/10/2019  Patient's Name: Jamie Archer MRN: [id number] Date of Birth: 22-Dec-1990  Referring Provider: Dr. Ellouise Newer  Clinical History: This is a 28 year old woman with recurrent seizures with olfactory hallucinations, staring, convulsions, and post-ictal right-sided weakness. EEG for classification.  Medications: Topiramate  Technical Summary: A multichannel digital 1-hour EEG recording measured by the international 10-20 system with electrodes applied with paste and impedances below 5000 ohms performed in our laboratory with EKG monitoring in an awake and asleep patient.  Hyperventilation was not performed. Photic stimulation was performed.  The digital EEG was referentially recorded, reformatted, and digitally filtered in a variety of bipolar and referential montages for optimal display.    Description: The patient is awake and asleep during the recording.  During maximal wakefulness, there is a symmetric, medium voltage 10 Hz posterior dominant rhythm that attenuates with eye opening.  There is occasional focal 3-4 Hz slowing over the left frontotemporal region. During drowsiness and sleep, there is an increase in theta slowing of the background.  Vertex waves and symmetric sleep spindles were seen.  Photic stimulation did not elicit any abnormalities.  There were no epileptiform discharges or electrographic seizures seen.    EKG lead was unremarkable.  Impression: This 1-hour awake and asleep EEG is abnormal due to occasional focal slowing over the left frontotemporal region.  Clinical Correlation of the above findings indicates focal cerebral dysfunction over the left frontotemporal region suggestive of underlying structural or physiologic abnormality. The absence of epileptiform discharges does not exclude a clinical diagnosis of epilepsy. Clinical correlation is advised.   Ellouise Newer, M.D.

## 2019-09-29 ENCOUNTER — Other Ambulatory Visit: Payer: Medicaid Other

## 2019-10-10 ENCOUNTER — Ambulatory Visit: Payer: Medicaid Other | Admitting: Neurology

## 2019-12-04 ENCOUNTER — Other Ambulatory Visit: Payer: Self-pay

## 2019-12-04 ENCOUNTER — Telehealth (INDEPENDENT_AMBULATORY_CARE_PROVIDER_SITE_OTHER): Payer: BC Managed Care – PPO | Admitting: Neurology

## 2019-12-04 ENCOUNTER — Encounter: Payer: Self-pay | Admitting: Neurology

## 2019-12-04 DIAGNOSIS — G43009 Migraine without aura, not intractable, without status migrainosus: Secondary | ICD-10-CM | POA: Diagnosis not present

## 2019-12-04 DIAGNOSIS — G40009 Localization-related (focal) (partial) idiopathic epilepsy and epileptic syndromes with seizures of localized onset, not intractable, without status epilepticus: Secondary | ICD-10-CM

## 2019-12-04 MED ORDER — TOPIRAMATE 100 MG PO TABS: 100.0000 mg | ORAL_TABLET | Freq: Two times a day (BID) | ORAL | 11 refills | Status: DC

## 2019-12-04 NOTE — Addendum Note (Signed)
Addended by: Dimas Chyle on: 12/04/2019 11:27 AM   Modules accepted: Orders
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
## 2020-01-21 ENCOUNTER — Ambulatory Visit
Admission: RE | Admit: 2020-01-21 | Discharge: 2020-01-21 | Disposition: A | Discharge status: Home / Self Care | Payer: BC Managed Care – PPO | Source: Ambulatory Visit | Attending: Neurology | Admitting: Neurology

## 2020-01-21 ENCOUNTER — Other Ambulatory Visit: Payer: Self-pay

## 2020-01-21 DIAGNOSIS — G43009 Migraine without aura, not intractable, without status migrainosus: Secondary | ICD-10-CM

## 2020-01-21 DIAGNOSIS — G40009 Localization-related (focal) (partial) idiopathic epilepsy and epileptic syndromes with seizures of localized onset, not intractable, without status epilepticus: Secondary | ICD-10-CM

## 2020-01-21 MED ORDER — GADOBENATE DIMEGLUMINE 529 MG/ML IV SOLN
20.0000 mL | Freq: Once | INTRAVENOUS | Status: AC | PRN
  Administered 2020-01-21: 20 mL via INTRAVENOUS

## 2020-01-22 ENCOUNTER — Telehealth: Payer: Self-pay

## 2020-01-22 NOTE — Telephone Encounter (Signed)
Pt called and informed that MRI brain looked good, no evidence of tumor, stroke, or bleed

## 2020-01-22 NOTE — Telephone Encounter (Signed)
-----   Message from Van Clines, MD sent at 01/22/2020  8:39 AM EDT ----- Pls let patient know MRI brain looked good, no evidence of tumor, stroke, or bleed. Thanks

## 2020-06-11 ENCOUNTER — Ambulatory Visit (INDEPENDENT_AMBULATORY_CARE_PROVIDER_SITE_OTHER): Payer: BC Managed Care – PPO | Admitting: Neurology

## 2020-06-11 ENCOUNTER — Encounter: Payer: Self-pay | Admitting: Neurology

## 2020-06-11 ENCOUNTER — Other Ambulatory Visit: Payer: Self-pay

## 2020-06-11 VITALS — BP 141/95 | HR 74 | Ht 65.0 in | Wt 255.0 lb

## 2020-06-11 DIAGNOSIS — G40009 Localization-related (focal) (partial) idiopathic epilepsy and epileptic syndromes with seizures of localized onset, not intractable, without status epilepticus: Secondary | ICD-10-CM | POA: Diagnosis not present

## 2020-06-11 DIAGNOSIS — G43009 Migraine without aura, not intractable, without status migrainosus: Secondary | ICD-10-CM | POA: Diagnosis not present

## 2020-06-11 MED ORDER — TOPIRAMATE 100 MG PO TABS: 100.0000 mg | ORAL_TABLET | Freq: Two times a day (BID) | ORAL | 3 refills | Status: DC
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
## 2020-07-15 DIAGNOSIS — Z0279 Encounter for issue of other medical certificate: Secondary | ICD-10-CM

## 2020-10-28 ENCOUNTER — Emergency Department (HOSPITAL_BASED_OUTPATIENT_CLINIC_OR_DEPARTMENT_OTHER): Payer: BC Managed Care – PPO

## 2020-10-28 ENCOUNTER — Other Ambulatory Visit: Payer: Self-pay

## 2020-10-28 ENCOUNTER — Emergency Department (HOSPITAL_BASED_OUTPATIENT_CLINIC_OR_DEPARTMENT_OTHER)
Admission: EM | Admit: 2020-10-28 | Discharge: 2020-10-28 | Disposition: A | Discharge status: Home / Self Care | Payer: BC Managed Care – PPO | Attending: Emergency Medicine | Admitting: Emergency Medicine

## 2020-10-28 ENCOUNTER — Encounter (HOSPITAL_BASED_OUTPATIENT_CLINIC_OR_DEPARTMENT_OTHER): Payer: Self-pay

## 2020-10-28 DIAGNOSIS — Z20822 Contact with and (suspected) exposure to covid-19: Secondary | ICD-10-CM

## 2020-10-28 DIAGNOSIS — F17210 Nicotine dependence, cigarettes, uncomplicated: Secondary | ICD-10-CM | POA: Insufficient documentation

## 2020-10-28 DIAGNOSIS — R059 Cough, unspecified: Secondary | ICD-10-CM | POA: Diagnosis present

## 2020-10-28 DIAGNOSIS — J069 Acute upper respiratory infection, unspecified: Secondary | ICD-10-CM | POA: Diagnosis not present

## 2020-10-28 DIAGNOSIS — U071 COVID-19: Secondary | ICD-10-CM | POA: Insufficient documentation

## 2020-10-28 MED ORDER — IBUPROFEN 800 MG PO TABS
800.0000 mg | ORAL_TABLET | Freq: Once | ORAL | Status: AC
  Administered 2020-10-28: 800 mg via ORAL
  Filled 2020-10-28: qty 1

## 2020-10-28 MED ORDER — BENZONATATE 100 MG PO CAPS: 100.0000 mg | ORAL_CAPSULE | Freq: Three times a day (TID) | ORAL | 0 refills | Status: DC

## 2020-10-28 MED ORDER — PROMETHAZINE-DM 6.25-15 MG/5ML PO SYRP: 5.0000 mL | ORAL_SOLUTION | Freq: Four times a day (QID) | ORAL | 0 refills | Status: DC | PRN

## 2020-10-28 MED ORDER — ONDANSETRON 4 MG PO TBDP: 4.0000 mg | ORAL_TABLET | Freq: Three times a day (TID) | ORAL | 0 refills | Status: DC | PRN

## 2020-10-28 NOTE — Discharge Instructions (Addendum)
Chest x-ray today was unremarkable (very reassuring).  Please follow-up with your primary care doctor.  If your COVID test is positive please follow-up with the [facility] Please use Tylenol or ibuprofen for pain.  You may use 600 mg ibuprofen every 6 hours or 1000 mg of Tylenol every 6 hours.  You may choose to alternate between the 2.  This would be most effective.  Not to exceed 4 g of Tylenol within 24 hours.  Not to exceed 3200 mg ibuprofen 24 hours.   Your COVID test is pending; you should expect results within 24 hours. You can access your results on your MyChart--if you test positive you should receive a phone call.  In the meantime follow CDC guidelines and quarantine, wear a mask, wash hands often.   Please take over the counter vitamin D 2000-4000 units per day. I also recommend zinc 50 mg per day for the next two weeks.   Please return to ED if you feel have difficulty breathing or have emergent, new or concerning symptoms.  Patients who have symptoms consistent with COVID-19 should self isolated for: At least 3 days (72 hours) have passed since recovery, defined as resolution of fever without the use of fever reducing medications and improvement in respiratory symptoms (e.g., cough, shortness of breath), and At least 7 days have passed since symptoms first appeared.       Person Under Monitoring Name: [facility]  Location: [address] [place] [address]   Infection Prevention Recommendations for Individuals Confirmed to have, or Being Evaluated for, 2019 Novel Coronavirus (COVID-19) Infection Who Receive Care at Home  Individuals who are confirmed to have, or are being evaluated for, COVID-19 should follow the prevention steps below until a healthcare provider or local or state health department says they can return to normal activities.  Stay home except to get medical care You should restrict activities outside your home, except for getting medical care. Do  not go to work, school, or public areas, and do not use public transportation or taxis.  Call ahead before visiting your doctor Before your medical appointment, call the healthcare provider and tell them that you have, or are being evaluated for, COVID-19 infection. This will help the healthcare providers office take steps to keep other people from getting infected. Ask your healthcare provider to call the local or state health department.  Monitor your symptoms Seek prompt medical attention if your illness is worsening (e.g., difficulty breathing). Before going to your medical appointment, call the healthcare provider and tell them that you have, or are being evaluated for, COVID-19 infection. Ask your healthcare provider to call the local or state health department.  Wear a facemask You should wear a facemask that covers your nose and mouth when you are in the same room with other people and when you visit a healthcare provider. People who live with or visit you should also wear a facemask while they are in the same room with you.  Separate yourself from other people in your home As much as possible, you should stay in a different room from other people in your home. Also, you should use a separate bathroom, if available.  Avoid sharing household items You should not share dishes, drinking glasses, cups, eating utensils, towels, bedding, or other items with other people in your home. After using these items, you should wash them thoroughly with soap and water.  Cover your coughs and sneezes Cover your mouth and nose with a  tissue when you cough or sneeze, or you can cough or sneeze into your sleeve. Throw used tissues in a lined trash can, and immediately wash your hands with soap and water for at least 20 seconds or use an alcohol-based hand rub.  Wash your [employer] your hands often and thoroughly with soap and water for at least 20 seconds. You can use an alcohol-based  hand sanitizer if soap and water are not available and if your hands are not visibly dirty. Avoid touching your eyes, nose, and mouth with unwashed hands.   Prevention Steps for Caregivers and Household Members of Individuals Confirmed to have, or Being Evaluated for, COVID-19 Infection Being Cared for in the Home  If you live with, or provide care at home for, a person confirmed to have, or being evaluated for, COVID-19 infection please follow these guidelines to prevent infection:  Follow healthcare providers instructions Make sure that you understand and can help the patient follow any healthcare provider instructions for all care.  Provide for the patients basic needs You should help the patient with basic needs in the home and provide support for getting groceries, prescriptions, and other personal needs.  Monitor the patients symptoms If they are getting sicker, call his or her medical provider and tell them that the patient has, or is being evaluated for, COVID-19 infection. This will help the healthcare providers office take steps to keep other people from getting infected. Ask the healthcare provider to call the local or state health department.  Limit the number of people who have contact with the patient If possible, have only one caregiver for the patient. Other household members should stay in another home or place of residence. If this is not possible, they should stay in another room, or be separated from the patient as much as possible. Use a separate bathroom, if available. Restrict visitors who do not have an essential need to be in the home.  Keep older adults, very young children, and other sick people away from the patient Keep older adults, very young children, and those who have compromised immune systems or chronic health conditions away from the patient. This includes people with chronic heart, lung, or kidney conditions, diabetes, and cancer.  Ensure good  ventilation Make sure that shared spaces in the home have good air flow, such as from an air conditioner or an opened window, weather permitting.  Wash your hands often Wash your hands often and thoroughly with soap and water for at least 20 seconds. You can use an alcohol based hand sanitizer if soap and water are not available and if your hands are not visibly dirty. Avoid touching your eyes, nose, and mouth with unwashed hands. Use disposable paper towels to dry your hands. If not available, use dedicated cloth towels and replace them when they become wet.  Wear a facemask and gloves Wear a disposable facemask at all times in the room and gloves when you touch or have contact with the patients blood, body fluids, and/or secretions or excretions, such as sweat, saliva, sputum, nasal mucus, vomit, urine, or feces.  Ensure the mask fits over your nose and mouth tightly, and do not touch it during use. Throw out disposable facemasks and gloves after using them. Do not reuse. Wash your hands immediately after removing your facemask and gloves. If your personal clothing becomes contaminated, carefully remove clothing and launder. Wash your hands after handling contaminated clothing. Place all used disposable facemasks, gloves, and other waste in  a lined container before disposing them with other household waste. Remove gloves and wash your hands immediately after handling these items.  Do not share dishes, glasses, or other household items with the patient Avoid sharing household items. You should not share dishes, drinking glasses, cups, eating utensils, towels, bedding, or other items with a patient who is confirmed to have, or being evaluated for, COVID-19 infection. After the person uses these items, you should wash them thoroughly with soap and water.  Wash laundry thoroughly Immediately remove and wash clothes or bedding that have blood, body fluids, and/or secretions or excretions, such as  sweat, saliva, sputum, nasal mucus, vomit, urine, or feces, on them. Wear gloves when handling laundry from the patient. Read and follow directions on labels of laundry or clothing items and detergent. In general, wash and dry with the warmest temperatures recommended on the label.  Clean all areas the individual has used often Clean all touchable surfaces, such as counters, tabletops, doorknobs, bathroom fixtures, toilets, phones, keyboards, tablets, and bedside tables, every day. Also, clean any surfaces that may have blood, body fluids, and/or secretions or excretions on them. Wear gloves when cleaning surfaces the patient has come in contact with. Use a diluted bleach solution (e.g., dilute bleach with 1 part bleach and 10 parts water) or a household disinfectant with a label that says EPA-registered for coronaviruses. To make a bleach solution at home, add 1 tablespoon of bleach to 1 quart (4 cups) of water. For a larger supply, add  cup of bleach to 1 gallon (16 cups) of water. Read labels of cleaning products and follow recommendations provided on product labels. Labels contain instructions for safe and effective use of the cleaning product including precautions you should take when applying the product, such as wearing gloves or eye protection and making sure you have good ventilation during use of the product. Remove gloves and wash hands immediately after cleaning.  Monitor yourself for signs and symptoms of illness Caregivers and household members are considered close contacts, should monitor their health, and will be asked to limit movement outside of the home to the extent possible. Follow the monitoring steps for close contacts listed on the symptom monitoring form.   ? If you have additional questions, contact your local health department or call the epidemiologist on call at [phone] (available 24/7). ? This guidance is subject to change. For the most up-to-date guidance from  [facility], please refer to their website: [email or web address]

## 2020-10-28 NOTE — ED Provider Notes (Signed)
[facility] EMERGENCY DEPARTMENT Provider Note   CSN: [id number] Arrival date & time: 10/28/20  1420     History Chief Complaint  Patient presents with  . Cough    Jamie Archer is a 30 y.o. female.  HPI Patient is obese 30 year old female with past medical history of seizures, pneumonia, migraine, reflux presenting today with objective fevers, cough, congestion, runny nose, she states that her cough has been productive of yellow-green sputum.  She states she has a history of pneumonia and feels as fatigued and worn out she did last time she was diagnosed with pneumonia.  She states she also has myalgias that are from her hips up.  She also has been having pain in her chest when she coughs.  She denies any hemoptysis.No recent surgeries, hospitalization, long travel, hemoptysis, estrogen containing OCP, cancer history.  No unilateral leg swelling.  No history of PE or VTE.  No other associate symptoms.  No aggravating mitigating factors.  She states she feels better after the ibuprofen that was given to her in triage.  No other medications prior to arrival.  She is not vaccinated for COVID.    Past Medical History:  Diagnosis Date  . Acid reflux   . Migraine   . Pneumonia   . Seizures [facility])     Patient Active Problem List   Diagnosis Date Noted  . Migraine 10/26/2013    History reviewed. No pertinent surgical history.   OB History   No obstetric history on file.     Family History  Problem Relation Age of Onset  . Migraines Mother   . Migraines Father   . Migraines Maternal Grandmother     Social History   Tobacco Use  . Smoking status: Current Every Day Smoker    Packs/day: 0.50    Types: Cigarettes  . Smokeless tobacco: Never Used  Vaping Use  . Vaping Use: Never used  Substance Use Topics  . Alcohol use: Not Currently  . Drug use: Yes    Types: Marijuana    Home Medications Prior to Admission medications   Medication Sig Start Date End  Date Taking? Authorizing Provider  benzonatate (TESSALON) 100 MG capsule Take 1 capsule (100 mg total) by mouth every 8 (eight) hours. 10/28/20  Yes Xaria Judon S, PA  ondansetron (ZOFRAN ODT) 4 MG disintegrating tablet Take 1 tablet (4 mg total) by mouth every 8 (eight) hours as needed for nausea or vomiting. 10/28/20  Yes Esaias Cleavenger S, PA  promethazine-dextromethorphan (PROMETHAZINE-DM) 6.25-15 MG/5ML syrup Take 5 mLs by mouth 4 (four) times daily as needed for cough. 10/28/20  Yes Latoia Eyster S, PA  topiramate (TOPAMAX) 100 MG tablet Take 1 tablet (100 mg total) by mouth 2 (two) times daily. 06/11/20   Van Clines, MD    Allergies    Azithromycin, Codeine, and Penicillins  Review of Systems   Review of Systems  Constitutional: Positive for chills, fatigue and fever.  HENT: Positive for congestion, postnasal drip, rhinorrhea and sore throat.   Eyes: Negative for redness.  Respiratory: Positive for cough and shortness of breath.   Cardiovascular: Negative for chest pain and leg swelling.  Gastrointestinal: Negative for abdominal pain, diarrhea, nausea and vomiting.  Endocrine: Negative for polyphagia.  Genitourinary: Negative for dysuria.  Musculoskeletal: Positive for myalgias.  Skin: Negative for rash.  Neurological: Negative for syncope and headaches.  Psychiatric/Behavioral: Negative for confusion.    Physical Exam Updated Vital Signs BP 133/89 (BP Location: Right Arm)  Pulse (!) 57   Temp 98.7 F (37.1 C) (Oral)   Resp 20   Ht 5\' 5"  (1.651 m)   Wt 113.4 kg   LMP 10/15/2020   SpO2 100%   BMI 41.60 kg/m   Physical Exam Vitals and nursing note reviewed.  Constitutional:      General: She is not in acute distress.    Appearance: She is obese.     Comments: Pleasant well-appearing obese 30 year old.  In no acute distress.  Sitting comfortably in bed.  Able answer questions appropriately follow commands. No increased work of breathing. Speaking in full  sentences.  HENT:     Head: Normocephalic and atraumatic.     Nose: Nose normal.  Eyes:     General: No scleral icterus. Cardiovascular:     Rate and Rhythm: Normal rate and regular rhythm.     Pulses: Normal pulses.     Heart sounds: Normal heart sounds.  Pulmonary:     Effort: Pulmonary effort is normal. No respiratory distress.     Breath sounds: No wheezing.     Comments: Lungs clear to auscultation all fields exam somewhat limited due to patient obesity. No significant increased work of breathing.  SPO2 100% on room air.  Respirations unlabored no accessory muscle usage. Abdominal:     Palpations: Abdomen is soft.     Tenderness: There is no abdominal tenderness.  Musculoskeletal:     Cervical back: Normal range of motion.     Right lower leg: No edema.     Left lower leg: No edema.  Skin:    General: Skin is warm and dry.     Capillary Refill: Capillary refill takes less than 2 seconds.  Neurological:     Mental Status: She is alert. Mental status is at baseline.  Psychiatric:        Mood and Affect: Mood normal.        Behavior: Behavior normal.     ED Results / Procedures / Treatments   Labs (all labs ordered are listed, but only abnormal results are displayed) Labs Reviewed  SARS CORONAVIRUS 2 (TAT 6-24 HRS)    EKG None  Radiology DG Chest Port 1 View  Result Date: 10/28/2020 CLINICAL DATA:  Chest pain flu like symptoms EXAM: PORTABLE CHEST 1 VIEW COMPARISON:  12/16/2016 FINDINGS: The heart size and mediastinal contours are within normal limits. Both lungs are clear. The visualized skeletal structures are unremarkable. IMPRESSION: No active disease. Electronically Signed   By: 02/15/2017 M.D.   On: 10/28/2020 19:02    Procedures Procedures (including critical care time)  Medications Ordered in ED Medications  ibuprofen (ADVIL) tablet 800 mg (800 mg Oral Given 10/28/20 1434)    ED Course  I have reviewed the triage vital signs and the nursing  notes.  Pertinent labs & imaging results that were available during my care of the patient were reviewed by me and considered in my medical decision making (see chart for details).    MDM Rules/Calculators/A&P                           Patient is 30 year old female presented today with symptom scribed in HPI she is primarily here for cough congestion fevers.  Fever was initially one 101.5 in triage however improved with ibuprofen.  She feels improved currently.  We will significantly less achy.  Is still complaining of cough.  Overall is very well-appearing on physical exam.  She is without any increased work of breathing or tachypnea.  Lung exam is somewhat hampered due to morbid obesity.  Will obtain chest x-ray.  Chest x-ray reviewed by myself and negative for pneumonia.  Agree with radiology read no acute abnormality.  We will discharge patient with Tylenol ibuprofen recommendations  Return precautions.  I have low suspicion for COVID myocarditis or PE.  She understands that as an unvaccinated obese female she may have extended period of feeling ill however I gave patient extensive and strict return precaution limits.  She is agreeable to plan at this time and has no further questions.  [place] was evaluated in Emergency Department on 10/28/2020 for the symptoms described in the history of present illness. She was evaluated in the context of the global COVID-19 pandemic, which necessitated consideration that the patient might be at risk for infection with the SARS-CoV-2 virus that causes COVID-19. Institutional protocols and algorithms that pertain to the evaluation of patients at risk for COVID-19 are in a state of rapid change based on information released by regulatory bodies including the CDC and federal and state organizations. These policies and algorithms were followed during the patient's care in the ED.  Final Clinical Impression(s) / ED Diagnoses Final diagnoses:  Viral URI  with cough  Suspected COVID-19 virus infection    Rx / DC Orders ED Discharge Orders         Ordered    benzonatate (TESSALON) 100 MG capsule  Every 8 hours        10/28/20 1934    promethazine-dextromethorphan (PROMETHAZINE-DM) 6.25-15 MG/5ML syrup  4 times daily PRN        10/28/20 1934    ondansetron (ZOFRAN ODT) 4 MG disintegrating tablet  Every 8 hours PRN        10/28/20 1934           Gailen Shelter, [place] 10/28/20 1936    Vanetta Mulders, MD 10/30/20 1900

## 2020-10-28 NOTE — ED Triage Notes (Signed)
Pt c/o flu like sx 4-5 days-NAD-steady gait

## 2020-10-29 LAB — SARS CORONAVIRUS 2 (TAT 6-24 HRS): SARS Coronavirus 2: POSITIVE — AB

## 2020-10-30 ENCOUNTER — Encounter: Payer: Self-pay | Admitting: Oncology

## 2020-10-30 ENCOUNTER — Telehealth: Payer: Self-pay | Admitting: Oncology

## 2020-10-30 NOTE — Telephone Encounter (Signed)
Called to discuss with patient about COVID-19 symptoms and the use of one of the available treatments for those with mild to moderate Covid symptoms and at a high risk of hospitalization.  Pt appears to qualify for outpatient treatment due to co-morbid conditions and/or a member of an at-risk group in accordance with the FDA Emergency Use Authorization.    Symptom onset: 10/28/20 Vaccinated: No Booster? No Immunocompromised? No  Qualifiers:  Past Medical History:  Diagnosis Date  . Acid reflux   . Migraine   . Pneumonia   . Seizures (HCC)    Obesity   Spoke with patient. She would like more information and to discuss with her family.  She will call back with questions.   Mauro Kaufmann

## 2021-01-20 ENCOUNTER — Telehealth: Payer: Self-pay | Admitting: Neurology

## 2021-01-20 NOTE — Telephone Encounter (Signed)
Telephone call to pt, Advised pt Dr.Aqunio nurse faxed FMLA forms.

## 2021-01-20 NOTE — Telephone Encounter (Signed)
Patient called in stating she faxed in FMLA paperwork on 01/15/21 and wants to make sure we did not receive it. I do not see it up front or any notes. The patient would like to double check on it since it's $15 to fax it to [place] again.

## 2021-02-09 ENCOUNTER — Encounter: Payer: Self-pay | Admitting: Neurology

## 2021-02-09 ENCOUNTER — Ambulatory Visit (INDEPENDENT_AMBULATORY_CARE_PROVIDER_SITE_OTHER): Payer: BC Managed Care – PPO | Admitting: Neurology

## 2021-02-09 ENCOUNTER — Other Ambulatory Visit: Payer: Self-pay

## 2021-02-09 VITALS — BP 130/84 | HR 66 | Ht 65.0 in | Wt 267.6 lb

## 2021-02-09 DIAGNOSIS — G40009 Localization-related (focal) (partial) idiopathic epilepsy and epileptic syndromes with seizures of localized onset, not intractable, without status epilepticus: Secondary | ICD-10-CM | POA: Diagnosis not present

## 2021-02-09 DIAGNOSIS — G43009 Migraine without aura, not intractable, without status migrainosus: Secondary | ICD-10-CM | POA: Diagnosis not present

## 2021-02-09 MED ORDER — TOPIRAMATE 100 MG PO TABS: ORAL_TABLET | ORAL | 3 refills | Status: DC
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
## 2021-02-09 NOTE — Patient Instructions (Signed)
Always good to see you!   1. Increase Topamax 100mg : Take 1 and 1/2 tablets twice a day  2. Continue with seizure trigger avoidance. Consider counseling for stress management if needed  3. As per [place] driving laws, no driving until 6 months seizure-free  4. Follow-up in 6 months, call for any changes   Seizure Precautions: 1. If medication has been prescribed for you to prevent seizures, take it exactly as directed.  Do not stop taking the medicine without talking to your doctor first, even if you have not had a seizure in a long time.   2. Avoid activities in which a seizure would cause danger to yourself or to others.  Don't operate dangerous machinery, swim alone, or climb in high or dangerous places, such as on ladders, roofs, or girders.  Do not drive unless your doctor says you may.  3. If you have any warning that you may have a seizure, lay down in a safe place where you can't hurt yourself.    4.  No driving for 6 months from last seizure, as per [facility].   Please refer to the following link on the Epilepsy Foundation of America's website for more information: http://www.epilepsyfoundation.org/answerplace/Social/driving/drivingu.cfm   5.  Maintain good sleep hygiene. Avoid alcohol.  6.  Notify your neurology if you are planning pregnancy or if you become pregnant.  7.  Contact your doctor if you have any problems that may be related to the medicine you are taking.  8.  Call 911 and bring the patient back to the ED if:        A.  The seizure lasts longer than 5 minutes.       B.  The patient doesn't awaken shortly after the seizure  C.  The patient has new problems such as difficulty seeing, speaking or moving  D.  The patient was injured during the seizure  E.  The patient has a temperature over 102 F (39C)  F.  The patient vomited and now is having trouble breathing

## 2021-02-09 NOTE — Progress Notes (Signed)
Seizure before thanksgiving   Started new birth control unsure of name will send in my chart

## 2021-03-06 ENCOUNTER — Emergency Department (HOSPITAL_BASED_OUTPATIENT_CLINIC_OR_DEPARTMENT_OTHER)
Admission: EM | Admit: 2021-03-06 | Discharge: 2021-03-06 | Disposition: A | Discharge status: Home / Self Care | Payer: BC Managed Care – PPO | Attending: Emergency Medicine | Admitting: Emergency Medicine

## 2021-03-06 ENCOUNTER — Encounter (HOSPITAL_BASED_OUTPATIENT_CLINIC_OR_DEPARTMENT_OTHER): Payer: Self-pay | Admitting: *Deleted

## 2021-03-06 ENCOUNTER — Other Ambulatory Visit: Payer: Self-pay

## 2021-03-06 DIAGNOSIS — F17210 Nicotine dependence, cigarettes, uncomplicated: Secondary | ICD-10-CM | POA: Diagnosis not present

## 2021-03-06 DIAGNOSIS — B373 Candidiasis of vulva and vagina: Secondary | ICD-10-CM | POA: Diagnosis not present

## 2021-03-06 DIAGNOSIS — R300 Dysuria: Secondary | ICD-10-CM | POA: Diagnosis present

## 2021-03-06 DIAGNOSIS — B3731 Acute candidiasis of vulva and vagina: Secondary | ICD-10-CM

## 2021-03-06 LAB — URINALYSIS, ROUTINE W REFLEX MICROSCOPIC
Bilirubin Urine: NEGATIVE
Glucose, UA: NEGATIVE mg/dL
Hgb urine dipstick: NEGATIVE
Ketones, ur: NEGATIVE mg/dL
Leukocytes,Ua: NEGATIVE
Nitrite: NEGATIVE
Protein, ur: NEGATIVE mg/dL
Specific Gravity, Urine: 1.02 (ref 1.005–1.030)
pH: 7 (ref 5.0–8.0)

## 2021-03-06 LAB — WET PREP, GENITAL
Clue Cells Wet Prep HPF POC: NONE SEEN
Sperm: NONE SEEN
Trich, Wet Prep: NONE SEEN

## 2021-03-06 LAB — PREGNANCY, URINE: Preg Test, Ur: NEGATIVE

## 2021-03-06 MED ORDER — FLUCONAZOLE 200 MG PO TABS: 200.0000 mg | ORAL_TABLET | Freq: Once | ORAL | 0 refills | Status: AC

## 2021-03-06 MED ORDER — FLUCONAZOLE 150 MG PO TABS
150.0000 mg | ORAL_TABLET | Freq: Once | ORAL | Status: AC
  Administered 2021-03-06: 150 mg via ORAL
  Filled 2021-03-06: qty 1

## 2021-03-06 NOTE — ED Provider Notes (Signed)
[facility] EMERGENCY DEPARTMENT Provider Note   CSN: [id number] Arrival date & time: 03/06/21  1629     History Chief Complaint  Patient presents with  . Dysuria    Jamie Archer is a 30 y.o. female.  HPI Patient is a 30 year old female with a medical history as noted below.  She presents to the emergency department due to dysuria.  She states her symptoms are intermittent and started about 3 days ago.  Reports associated white vaginal discharge as well as lower abdominal cramping.  She states the abdominal cramping started at the onset of her symptoms and denies any current abdominal pain or pelvic pain.  No vaginal bleeding.  No other complaints.  She has been sexually active with 1 female partner in the past 6 months.  They do not use protection.    Past Medical History:  Diagnosis Date  . Acid reflux   . Migraine   . Pneumonia   . Seizures [facility])     Patient Active Problem List   Diagnosis Date Noted  . Migraine 10/26/2013    Past Surgical History:  Procedure Laterality Date  . WISDOM TOOTH EXTRACTION     bilateral bottom     OB History   No obstetric history on file.     Family History  Problem Relation Age of Onset  . Migraines Mother   . Migraines Father   . Migraines Maternal Grandmother     Social History   Tobacco Use  . Smoking status: Current Every Day Smoker    Packs/day: 0.50    Types: Cigarettes  . Smokeless tobacco: Never Used  Vaping Use  . Vaping Use: Never used  Substance Use Topics  . Alcohol use: Not Currently  . Drug use: Yes    Types: Marijuana    Home Medications Prior to Admission medications   Medication Sig Start Date End Date Taking? Authorizing Provider  fluconazole (DIFLUCAN) 200 MG tablet Take 1 tablet (200 mg total) by mouth once for 1 dose. 03/06/21 03/06/21 Yes Placido Sou, PA-C  topiramate (TOPAMAX) 100 MG tablet Take 1 and 1/2 tablets twice a day 02/09/21  Yes Van Clines, MD  benzonatate  (TESSALON) 100 MG capsule Take 1 capsule (100 mg total) by mouth every 8 (eight) hours. 10/28/20   Fondaw, Rodrigo Ran, PA  ondansetron (ZOFRAN ODT) 4 MG disintegrating tablet Take 1 tablet (4 mg total) by mouth every 8 (eight) hours as needed for nausea or vomiting. 10/28/20   Gailen Shelter, PA  promethazine-dextromethorphan (PROMETHAZINE-DM) 6.25-15 MG/5ML syrup Take 5 mLs by mouth 4 (four) times daily as needed for cough. 10/28/20   Gailen Shelter, PA    Allergies    Clindamycin/lincomycin, Azithromycin, Codeine, and Penicillins  Review of Systems   Review of Systems  All other systems reviewed and are negative. Ten systems reviewed and are negative for acute change, except as noted in the HPI.   Physical Exam Updated Vital Signs BP 119/79   Pulse 60   Temp 98.8 F (37.1 C) (Oral)   Resp 18   Ht 5\' 5"  (1.651 m)   Wt 118.7 kg   LMP 02/11/2021   SpO2 100%   BMI 43.53 kg/m   Physical Exam Vitals and nursing note reviewed.  Constitutional:      General: She is not in acute distress.    Appearance: Normal appearance. She is not ill-appearing, toxic-appearing or diaphoretic.  HENT:     Head: Normocephalic and atraumatic.  Right Ear: External ear normal.     Left Ear: External ear normal.     Nose: Nose normal.     Mouth/Throat:     Mouth: Mucous membranes are moist.     Pharynx: Oropharynx is clear. No oropharyngeal exudate or posterior oropharyngeal erythema.  Eyes:     Extraocular Movements: Extraocular movements intact.  Cardiovascular:     Rate and Rhythm: Normal rate.     Pulses: Normal pulses.  Pulmonary:     Effort: Pulmonary effort is normal.  Abdominal:     General: Abdomen is flat.     Palpations: Abdomen is soft.     Tenderness: There is no abdominal tenderness.     Comments: Protuberant abdomen that is soft and nontender in all 4 quadrants.  No suprapubic pain.  Genitourinary:    Comments: Female nursing chaperone present.  Normal-appearing vulvar  anatomy.  Normal-appearing vaginal mucosa.  Moderate amount of white/green discharge noted in the vaginal vault.  Erythema and increased friability noted on the cervix.  Closed cervical os.  No CMT or adnexal tenderness. Musculoskeletal:        General: Normal range of motion.     Cervical back: Normal range of motion and neck supple. No tenderness.  Skin:    General: Skin is warm and dry.  Neurological:     General: No focal deficit present.     Mental Status: She is alert and oriented to person, place, and time.  Psychiatric:        Mood and Affect: Mood normal.        Behavior: Behavior normal.    ED Results / Procedures / Treatments   Labs (all labs ordered are listed, but only abnormal results are displayed) Labs Reviewed  WET PREP, GENITAL - Abnormal; Notable for the following components:      Result Value   Yeast Wet Prep HPF POC PRESENT (*)    WBC, Wet Prep HPF POC MODERATE (*)    All other components within normal limits  URINALYSIS, ROUTINE W REFLEX MICROSCOPIC - Abnormal; Notable for the following components:   APPearance CLOUDY (*)    All other components within normal limits  PREGNANCY, URINE  GC/CHLAMYDIA PROBE AMP ([place]) NOT AT [facility]   EKG None  Radiology No results found.  Procedures Procedures   Medications Ordered in ED Medications  fluconazole (DIFLUCAN) tablet 150 mg (150 mg Oral Given 03/06/21 1943)   ED Course  I have reviewed the triage vital signs and the nursing notes.  Pertinent labs & imaging results that were available during my care of the patient were reviewed by me and considered in my medical decision making (see chart for details).    MDM Rules/Calculators/A&P                          Pt is a 30 y.o. female who presents to the emergency department with intermittent dysuria as well as new onset vaginal discharge.  Labs: UA without abnormalities. Pregnancy test negative. Wet prep shows yeast as well as moderate white blood  cells. GC/chlamydia is pending.  I, Placido Sou, PA-C, personally reviewed and evaluated these images and lab results as part of my medical decision-making.  Physical exam is consistent with candidal vaginitis.  She does also have moderate white blood cells on her exam.  She is concerned regarding possible concomitant STI.  GC/chlamydia is pending.  Recommended she check these results on MyChart and  return for treatment if either is positive.  Recommended refraining from sex until she receives these results.  Feel the patient is stable for discharge at this time and she is agreeable.  Her questions were answered and she was amicable at the time of discharge.  Note: Portions of this report may have been transcribed using voice recognition software. Every effort was made to ensure accuracy; however, inadvertent computerized transcription errors may be present.   Final Clinical Impression(s) / ED Diagnoses Final diagnoses:  Candidal vaginitis   Rx / DC Orders ED Discharge Orders         Ordered    fluconazole (DIFLUCAN) 200 MG tablet   Once        03/06/21 1939           Placido Sou, PA-C 03/06/21 1946    Virgina Norfolk, DO 03/06/21 2005

## 2021-03-06 NOTE — Discharge Instructions (Addendum)
You have been given 1 dose of flucanazole in the emergency department which will help with your yeast infection.  I have given you a prescription for 1 additional dose that you can take in 3 days if you feel that you are still symptomatic.  Please check the results of your gonorrhea/chlamydia test on MyChart.  This should be available in 1 to 3 days.  If you are positive for either you will need to return to the health department, your regular doctor, urgent care, or the emergency department for treatment.  Please refrain from sexual activity until you receive the results of this test.  If you develop any new or worsening symptoms, please come back to the emergency department.  It was a pleasure to meet you.

## 2021-03-06 NOTE — ED Triage Notes (Signed)
Dysuria x 2 days.

## 2021-03-10 LAB — GC/CHLAMYDIA PROBE AMP (~~LOC~~) NOT AT ARMC
Chlamydia: NEGATIVE
Comment: NEGATIVE
Comment: NORMAL
Neisseria Gonorrhea: NEGATIVE

## 2021-07-03 DIAGNOSIS — Z0279 Encounter for issue of other medical certificate: Secondary | ICD-10-CM

## 2021-07-04 ENCOUNTER — Emergency Department (HOSPITAL_BASED_OUTPATIENT_CLINIC_OR_DEPARTMENT_OTHER)
Admission: EM | Admit: 2021-07-04 | Discharge: 2021-07-04 | Disposition: A | Discharge status: Home / Self Care | Payer: BC Managed Care – PPO | Attending: Emergency Medicine | Admitting: Emergency Medicine

## 2021-07-04 ENCOUNTER — Other Ambulatory Visit: Payer: Self-pay

## 2021-07-04 ENCOUNTER — Encounter (HOSPITAL_BASED_OUTPATIENT_CLINIC_OR_DEPARTMENT_OTHER): Payer: Self-pay

## 2021-07-04 DIAGNOSIS — F17210 Nicotine dependence, cigarettes, uncomplicated: Secondary | ICD-10-CM | POA: Insufficient documentation

## 2021-07-04 DIAGNOSIS — N939 Abnormal uterine and vaginal bleeding, unspecified: Secondary | ICD-10-CM | POA: Diagnosis not present

## 2021-07-04 DIAGNOSIS — N938 Other specified abnormal uterine and vaginal bleeding: Secondary | ICD-10-CM

## 2021-07-04 LAB — WET PREP, GENITAL
Clue Cells Wet Prep HPF POC: NONE SEEN
Sperm: NONE SEEN
Trich, Wet Prep: NONE SEEN
Yeast Wet Prep HPF POC: NONE SEEN

## 2021-07-04 LAB — URINALYSIS, ROUTINE W REFLEX MICROSCOPIC
Bilirubin Urine: NEGATIVE
Glucose, UA: NEGATIVE mg/dL
Hgb urine dipstick: NEGATIVE
Ketones, ur: NEGATIVE mg/dL
Leukocytes,Ua: NEGATIVE
Nitrite: NEGATIVE
Protein, ur: NEGATIVE mg/dL
Specific Gravity, Urine: 1.025 (ref 1.005–1.030)
pH: 6 (ref 5.0–8.0)

## 2021-07-04 LAB — PREGNANCY, URINE: Preg Test, Ur: NEGATIVE

## 2021-07-04 NOTE — ED Triage Notes (Signed)
Pt c/o vaginal bleeding that has been going on for the past month. Bleeding is on and off and has some occasional lower abdominal pain. Pt is on Depo shot.

## 2021-07-04 NOTE — Discharge Instructions (Signed)
1.  Follow-up with your gynecologist as soon as possible. 2.  Return to emergency department if you have worsening abdominal pain, fever or other concerning symptoms.

## 2021-07-04 NOTE — ED Provider Notes (Signed)
[facility] EMERGENCY DEPARTMENT Provider Note   CSN: [id number] Arrival date & time: 07/04/21  8546     History Chief Complaint  Patient presents with   Vaginal Bleeding    Jamie Archer is a 30 y.o. female.  HPI Patient reports she gets a Depo shot regularly.  Patient reports for the past month she has been spotting.  Her Depo shot was at the beginning of July.  She will be due in approximately 2 weeks.  Patient reports she is also had a small amount of vaginal discharge.  Occasionally gets some pelvic pain.  No pain today.  No fevers no nausea no vomiting.  Patient try to get a follow-up with her GYN but was told he did not have any appointments until October.    Past Medical History:  Diagnosis Date   Acid reflux    Migraine    Pneumonia    Seizures (HCC)     Patient Active Problem List   Diagnosis Date Noted   Migraine 10/26/2013    Past Surgical History:  Procedure Laterality Date   WISDOM TOOTH EXTRACTION     bilateral bottom     OB History   No obstetric history on file.     Family History  Problem Relation Age of Onset   Migraines Mother    Migraines Father    Migraines Maternal Grandmother     Social History   Tobacco Use   Smoking status: Every Day    Packs/day: 0.50    Types: Cigarettes   Smokeless tobacco: Never  Vaping Use   Vaping Use: Never used  Substance Use Topics   Alcohol use: Not Currently   Drug use: Yes    Types: Marijuana    Home Medications Prior to Admission medications   Medication Sig Start Date End Date Taking? Authorizing Provider  topiramate (TOPAMAX) 100 MG tablet Take 1 and 1/2 tablets twice a day 02/09/21   Van Clines, MD    Allergies    Clindamycin/lincomycin, Azithromycin, Codeine, and Penicillins  Review of Systems   Review of Systems 10 Systems reviewed and negative except as per HPI Physical Exam Updated Vital Signs BP 110/70 (BP Location: Right Arm)   Pulse (!) 48   Temp 98.5 F  (36.9 C) (Oral)   Resp 15   Ht 5\' 6"  (1.676 m)   Wt 111.1 kg   SpO2 100%   BMI 39.54 kg/m   Physical Exam Constitutional:      Comments: Alert nontoxic well in appearance  HENT:     Mouth/Throat:     Pharynx: Oropharynx is clear.  Cardiovascular:     Rate and Rhythm: Normal rate and regular rhythm.  Pulmonary:     Effort: Pulmonary effort is normal.     Breath sounds: Normal breath sounds.  Abdominal:     General: There is no distension.     Palpations: Abdomen is soft.     Tenderness: There is no abdominal tenderness. There is no guarding.  Genitourinary:    Comments: Normal external female genitalia.  Speculum cervix normal in appearance very scant amount of blood-tinged discharge in the vault.  biManual nontender. Musculoskeletal:        General: Normal range of motion.  Skin:    General: Skin is warm and dry.  Neurological:     General: No focal deficit present.     Mental Status: She is oriented to person, place, and time.  Psychiatric:  Mood and Affect: Mood normal.    ED Results / Procedures / Treatments   Labs (all labs ordered are listed, but only abnormal results are displayed) Labs Reviewed  WET PREP, GENITAL - Abnormal; Notable for the following components:      Result Value   WBC, Wet Prep HPF POC MODERATE (*)    All other components within normal limits  URINALYSIS, ROUTINE W REFLEX MICROSCOPIC  PREGNANCY, URINE  GC/CHLAMYDIA PROBE AMP ([place]) NOT AT [facility]    EKG None  Radiology No results found.  Procedures Procedures   Medications Ordered in ED Medications - No data to display  ED Course  I have reviewed the triage vital signs and the nursing notes.  Pertinent labs & imaging results that were available during my care of the patient were reviewed by me and considered in my medical decision making (see chart for details).    MDM Rules/Calculators/A&P                           Patient gets a routine Depo-Provera shot.   She has been getting spotting for about a month.  She also reports she is getting some vaginal discharge.  She is not having pain.  Pregnancy test is negative.  Exam does not suggest PID.  At this time I do not think she is empiric treatment for pelvic infection.  Patient made aware she will be called if GC or chlamydia are positive.  Patient will follow-up with GYN for dysfunctional uterine bleeding with Depo-Provera. Final Clinical Impression(s) / ED Diagnoses Final diagnoses:  Dysfunctional uterine bleeding    Rx / DC Orders ED Discharge Orders     None        Arby Barrette, MD 07/04/21 1116

## 2021-07-07 LAB — GC/CHLAMYDIA PROBE AMP (~~LOC~~) NOT AT ARMC
Chlamydia: NEGATIVE
Comment: NEGATIVE
Comment: NORMAL
Neisseria Gonorrhea: NEGATIVE

## 2021-08-14 ENCOUNTER — Telehealth (INDEPENDENT_AMBULATORY_CARE_PROVIDER_SITE_OTHER): Payer: BC Managed Care – PPO | Admitting: Neurology

## 2021-08-14 ENCOUNTER — Encounter: Payer: Self-pay | Admitting: Neurology

## 2021-08-14 ENCOUNTER — Other Ambulatory Visit: Payer: Self-pay

## 2021-08-14 VITALS — Ht 64.0 in | Wt 256.0 lb

## 2021-08-14 DIAGNOSIS — G40009 Localization-related (focal) (partial) idiopathic epilepsy and epileptic syndromes with seizures of localized onset, not intractable, without status epilepticus: Secondary | ICD-10-CM

## 2021-08-14 DIAGNOSIS — G43009 Migraine without aura, not intractable, without status migrainosus: Secondary | ICD-10-CM

## 2021-08-14 MED ORDER — TOPIRAMATE 200 MG PO TABS: 200.0000 mg | ORAL_TABLET | Freq: Two times a day (BID) | ORAL | 3 refills | Status: DC
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
## 2021-10-22 ENCOUNTER — Encounter (HOSPITAL_BASED_OUTPATIENT_CLINIC_OR_DEPARTMENT_OTHER): Payer: Self-pay | Admitting: Emergency Medicine

## 2021-10-22 ENCOUNTER — Emergency Department (HOSPITAL_BASED_OUTPATIENT_CLINIC_OR_DEPARTMENT_OTHER)
Admission: EM | Admit: 2021-10-22 | Discharge: 2021-10-22 | Disposition: A | Discharge status: Home / Self Care | Payer: BC Managed Care – PPO | Attending: Emergency Medicine | Admitting: Emergency Medicine

## 2021-10-22 ENCOUNTER — Other Ambulatory Visit: Payer: Self-pay

## 2021-10-22 DIAGNOSIS — U071 COVID-19: Secondary | ICD-10-CM | POA: Diagnosis not present

## 2021-10-22 DIAGNOSIS — J029 Acute pharyngitis, unspecified: Secondary | ICD-10-CM

## 2021-10-22 DIAGNOSIS — J069 Acute upper respiratory infection, unspecified: Secondary | ICD-10-CM

## 2021-10-22 DIAGNOSIS — R059 Cough, unspecified: Secondary | ICD-10-CM | POA: Diagnosis present

## 2021-10-22 LAB — GROUP A STREP BY PCR: Group A Strep by PCR: NOT DETECTED

## 2021-10-22 LAB — RESP PANEL BY RT-PCR (FLU A&B, COVID) ARPGX2
Influenza A by PCR: NEGATIVE
Influenza B by PCR: NEGATIVE
SARS Coronavirus 2 by RT PCR: POSITIVE — AB

## 2021-10-22 MED ORDER — DEXAMETHASONE 4 MG PO TABS
10.0000 mg | ORAL_TABLET | Freq: Once | ORAL | Status: AC
  Administered 2021-10-22: 10 mg via ORAL
  Filled 2021-10-22: qty 3

## 2021-10-22 NOTE — ED Triage Notes (Signed)
Pt with cough, body aches, sore throat.

## 2021-10-22 NOTE — ED Provider Notes (Signed)
[facility] EMERGENCY DEPARTMENT Provider Note   CSN: [id number] Arrival date & time: 10/22/21  [id number]     History  Chief Complaint  Patient presents with   Cough    Jamie Archer is a 31 y.o. female.  The history is provided by the patient.  Cough Cough characteristics:  Non-productive Sputum characteristics:  Nondescript Severity:  Mild Onset quality:  Gradual Duration:  2 days Timing:  Constant Progression:  Waxing and waning Chronicity:  New Context: upper respiratory infection   Relieved by:  Nothing Worsened by:  Nothing Associated symptoms: myalgias, sinus congestion and sore throat   Associated symptoms: no chest pain, no chills, no diaphoresis, no ear fullness, no ear pain, no eye discharge, no fever, no headaches, no rash, no rhinorrhea and no shortness of breath       Home Medications Prior to Admission medications   Medication Sig Start Date End Date Taking? Authorizing Provider  estradiol cypionate (DEPO-ESTRADIOL) 5 MG/ML injection Inject into the muscle every 28 (twenty-eight) days.    [provider]  topiramate (TOPAMAX) 200 MG tablet Take 1 tablet (200 mg total) by mouth 2 (two) times daily. 08/14/21   Cameron Sprang, MD      Allergies    Clindamycin/lincomycin, Azithromycin, Codeine, and Penicillins    Review of Systems   Review of Systems  Constitutional:  Negative for chills, diaphoresis and fever.  HENT:  Positive for sore throat. Negative for ear pain and rhinorrhea.   Eyes:  Negative for discharge.  Respiratory:  Positive for cough. Negative for shortness of breath.   Cardiovascular:  Negative for chest pain.  Musculoskeletal:  Positive for myalgias.  Skin:  Negative for rash.  Neurological:  Negative for headaches.   Physical Exam Updated Vital Signs BP (!) 149/82    Pulse 93    Temp 99.3 F (37.4 C) (Oral)    Resp 18    Ht 5\' 4"  (1.626 m)    Wt 120.2 kg    SpO2 99%    BMI 45.49 kg/m  Physical Exam Vitals and  nursing note reviewed.  Constitutional:      General: She is not in acute distress.    Appearance: She is well-developed.  HENT:     Head: Normocephalic and atraumatic.     Nose: Nose normal.     Mouth/Throat:     Mouth: Mucous membranes are moist.     Pharynx: Posterior oropharyngeal erythema present. No oropharyngeal exudate.  Eyes:     Extraocular Movements: Extraocular movements intact.     Conjunctiva/sclera: Conjunctivae normal.     Pupils: Pupils are equal, round, and reactive to light.  Cardiovascular:     Rate and Rhythm: Normal rate and regular rhythm.     Pulses: Normal pulses.     Heart sounds: Normal heart sounds. No murmur heard. Pulmonary:     Effort: Pulmonary effort is normal. No respiratory distress.     Breath sounds: Normal breath sounds.  Abdominal:     Palpations: Abdomen is soft.     Tenderness: There is no abdominal tenderness.  Musculoskeletal:        General: No swelling.     Cervical back: Normal range of motion and neck supple.  Skin:    General: Skin is warm and dry.     Capillary Refill: Capillary refill takes less than 2 seconds.  Neurological:     General: No focal deficit present.     Mental Status: She is alert.  Psychiatric:        Mood and Affect: Mood normal.    ED Results / Procedures / Treatments   Labs (all labs ordered are listed, but only abnormal results are displayed) Labs Reviewed  RESP PANEL BY RT-PCR (FLU A&B, COVID) [id number]  GROUP A STREP BY PCR    EKG None  Radiology No results found.  Procedures Procedures    Medications Ordered in ED Medications  dexamethasone (DECADRON) tablet 10 mg (has no administration in time range)    ED Course/ Medical Decision Making/ A&P Clinical Course as of 10/22/21 0715  Thu Oct 22, 2021  0712 Group A Strep by PCR [AC]    Clinical Course User Index [AC] Lennice Sites, DO                           Medical Decision Making  7:15 AM  [name] is here with sore  throat, body aches, viral symptoms.  Normal vitals.  No fever.  Patient overall very well-appearing.  Sore throat and body aches for the last 2 days.  No known sick contacts.  Has some erythema to her posterior oropharynx but no signs of respiratory distress and exam is overall unremarkable.  Differential diagnosis includes COVID/viral process versus strep pharyngitis versus less likely deeper space infection such as peritonsillar abscess.  Strep test and COVID test obtained.  Overall we will give a dose of Decadron.  Recommend Tylenol and ibuprofen.  We will call in antibiotic if strep test is positive.  Discharged in good condition.  This chart was dictated using voice recognition software.  Despite best efforts to proofread,  errors can occur which can change the documentation meaning.         Final Clinical Impression(s) / ED Diagnoses Final diagnoses:  Viral URI with cough  Sore throat    Rx / DC Orders ED Discharge Orders     None         Lennice Sites, DO 10/22/21 0715

## 2021-10-22 NOTE — Discharge Instructions (Signed)
Recommend Tylenol and ibuprofen for body aches and fever.  You have been treated with a long-acting steroid.  Follow-up your strep pharyngitis test on your MyChart as well as your viral testing.  If your strep test is positive I will call in an antibiotic to your pharmacy to take.

## 2021-10-23 ENCOUNTER — Telehealth (HOSPITAL_COMMUNITY): Payer: Self-pay

## 2021-12-07 DIAGNOSIS — Z0279 Encounter for issue of other medical certificate: Secondary | ICD-10-CM

## 2021-12-15 ENCOUNTER — Ambulatory Visit (INDEPENDENT_AMBULATORY_CARE_PROVIDER_SITE_OTHER): Payer: BC Managed Care – PPO | Admitting: Neurology

## 2021-12-15 ENCOUNTER — Encounter: Payer: Self-pay | Admitting: Neurology

## 2021-12-15 ENCOUNTER — Other Ambulatory Visit: Payer: Self-pay

## 2021-12-15 VITALS — BP 143/94 | HR 80 | Ht 66.0 in | Wt 286.2 lb

## 2021-12-15 DIAGNOSIS — G40009 Localization-related (focal) (partial) idiopathic epilepsy and epileptic syndromes with seizures of localized onset, not intractable, without status epilepticus: Secondary | ICD-10-CM

## 2021-12-15 DIAGNOSIS — G43009 Migraine without aura, not intractable, without status migrainosus: Secondary | ICD-10-CM

## 2021-12-15 MED ORDER — TOPIRAMATE 200 MG PO TABS: 200.0000 mg | ORAL_TABLET | Freq: Two times a day (BID) | ORAL | 3 refills | Status: DC
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
## 2021-12-15 NOTE — Patient Instructions (Signed)
Always good to see you. Continue Topiramate 200mg  twice a day. If BP still elevated, please establish care with PCP. Follow-up in 6 months, call for any changes. ? ? ?Seizure Precautions: ?1. If medication has been prescribed for you to prevent seizures, take it exactly as directed.  Do not stop taking the medicine without talking to your doctor first, even if you have not had a seizure in a long time.  ? ?2. Avoid activities in which a seizure would cause danger to yourself or to others.  Don't operate dangerous machinery, swim alone, or climb in high or dangerous places, such as on ladders, roofs, or girders.  Do not drive unless your doctor says you may. ? ?3. If you have any warning that you may have a seizure, lay down in a safe place where you can't hurt yourself.   ? ?4.  No driving for 6 months from last seizure, as per [facility].   Please refer to the following link on the [employer]ile[employer] of [employer]'s website for more information: http://www.epilepsyfoundation.org/answerplace/Social/driving/drivingu.cfm  ? ?5.  Maintain good sleep hygiene. Avoid alcohol. ? ?6.  Notify your neurology if you are planning pregnancy or if you become pregnant. ? ?7.  Contact your doctor if you have any problems that may be related to the medicine you are taking. ? ?8.  Call 911 and bring the patient back to the ED if: ?      ? A.  The seizure lasts longer than 5 minutes.      ? B.  The patient doesn't awaken shortly after the seizure ? C.  The patient has new problems such as difficulty seeing, speaking or moving ? D.  The patient was injured during the seizure ? E.  The patient has a temperature over 102 F (39C) ? F.  The patient vomited and now is having trouble breathing ?      ? ?

## 2021-12-18 ENCOUNTER — Telehealth: Payer: Self-pay

## 2021-12-18 NOTE — Telephone Encounter (Signed)
New message    Patient calling checking on the status of FMLA paperwork was advise the office received FMLA paperwork today .   Patient verbalized that it was incompleted due to last section question 8 was not filled out .   The patient is aware to call back next week to check on the status of FMLA paperwork.

## 2021-12-21 NOTE — Telephone Encounter (Signed)
Paperwork has been re faxed for pt  ?

## 2021-12-21 NOTE — Telephone Encounter (Signed)
Patient called to check on the status of the forms. She said this is an emergency, he job has declined the previous forms sent. ?

## 2021-12-23 ENCOUNTER — Telehealth: Payer: Self-pay | Admitting: Neurology

## 2021-12-23 NOTE — Telephone Encounter (Signed)
Pls check with her on what is wrong with the paperwork, it is in Heather's folder, can you pls go over with her what is the issue? Thanks! ?

## 2021-12-23 NOTE — Telephone Encounter (Signed)
Pt called back in about her paperwork. She says this is an emergency since her claim was denied due to how the paperwork was filled out. She would like a call back. ?

## 2021-12-23 NOTE — Telephone Encounter (Signed)
Called patient and she stated that number 8 on her FMLA form had to be changed to 2 days per one episode. Correction made and Dr. Karel Jarvis had initiated the correction and re-signed the paperwork. Faxed FMLA paperwork. Patient is aware.  ?

## 2021-12-23 NOTE — Telephone Encounter (Signed)
Called patient and left a message for a call back.  

## 2021-12-23 NOTE — Telephone Encounter (Signed)
Pt called in and left a message. She stated they denied her FMLA claim. She says her paperwork needs to be corrected.  ?

## 2022-01-04 ENCOUNTER — Telehealth: Payer: Self-pay | Admitting: Neurology

## 2022-01-04 NOTE — Telephone Encounter (Signed)
Patient called and stated that the date on her FMLA paperwork needs to be for Feb 16, it is for March, so its not covering any sooner than March. ?

## 2022-01-05 NOTE — Telephone Encounter (Signed)
Dates changed with addendum, thanks ?

## 2022-01-05 NOTE — Telephone Encounter (Signed)
Pt called an informed that we had changed her date and that I refaxed her forms ?

## 2022-07-26 ENCOUNTER — Encounter: Payer: Self-pay | Admitting: Neurology

## 2022-07-26 ENCOUNTER — Ambulatory Visit (INDEPENDENT_AMBULATORY_CARE_PROVIDER_SITE_OTHER): Payer: BC Managed Care – PPO | Admitting: Neurology

## 2022-07-26 VITALS — BP 144/83 | HR 79 | Ht 66.0 in | Wt 292.4 lb

## 2022-07-26 DIAGNOSIS — G43009 Migraine without aura, not intractable, without status migrainosus: Secondary | ICD-10-CM | POA: Diagnosis not present

## 2022-07-26 DIAGNOSIS — G40009 Localization-related (focal) (partial) idiopathic epilepsy and epileptic syndromes with seizures of localized onset, not intractable, without status epilepticus: Secondary | ICD-10-CM | POA: Diagnosis not present

## 2022-07-26 MED ORDER — TOPIRAMATE 200 MG PO TABS: 200.0000 mg | ORAL_TABLET | Freq: Two times a day (BID) | ORAL | 3 refills | Status: DC
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
## 2022-08-27 DIAGNOSIS — Z029 Encounter for administrative examinations, unspecified: Secondary | ICD-10-CM

## 2022-09-07 ENCOUNTER — Telehealth: Payer: Self-pay | Admitting: Neurology

## 2022-09-07 NOTE — Telephone Encounter (Signed)
Pt called in stating her FMLA paperwork was not received by [employer] and it is due today.

## 2022-09-07 NOTE — Telephone Encounter (Signed)
Done

## 2022-09-07 NOTE — Telephone Encounter (Signed)
Pt called no answer left a voice mail that paperwork has been faxed

## 2022-11-11 ENCOUNTER — Other Ambulatory Visit: Payer: Self-pay

## 2022-11-11 MED ORDER — TOPIRAMATE 200 MG PO TABS: 200.0000 mg | ORAL_TABLET | Freq: Two times a day (BID) | ORAL | 0 refills | Status: DC

## 2023-02-14 ENCOUNTER — Ambulatory Visit (INDEPENDENT_AMBULATORY_CARE_PROVIDER_SITE_OTHER): Payer: BC Managed Care – PPO | Admitting: Neurology

## 2023-02-14 ENCOUNTER — Encounter: Payer: Self-pay | Admitting: Neurology

## 2023-02-14 VITALS — BP 151/92 | HR 72 | Ht 65.0 in | Wt 286.6 lb

## 2023-02-14 DIAGNOSIS — G40009 Localization-related (focal) (partial) idiopathic epilepsy and epileptic syndromes with seizures of localized onset, not intractable, without status epilepticus: Secondary | ICD-10-CM | POA: Diagnosis not present

## 2023-02-14 DIAGNOSIS — G43009 Migraine without aura, not intractable, without status migrainosus: Secondary | ICD-10-CM

## 2023-02-14 MED ORDER — TOPIRAMATE 200 MG PO TABS: 200.0000 mg | ORAL_TABLET | Freq: Two times a day (BID) | ORAL | 3 refills | Status: DC

## 2023-02-14 NOTE — Patient Instructions (Signed)
Good to see you.  Have bloodwork done for Topamax level first thing in the morning before you take your first dose  2. Continue Topiramate 200mg  twice a day  3. Follow-up in 3 months, call for any changes   Seizure Precautions: 1. If medication has been prescribed for you to prevent seizures, take it exactly as directed.  Do not stop taking the medicine without talking to your doctor first, even if you have not had a seizure in a long time.   2. Avoid activities in which a seizure would cause danger to yourself or to others.  Don't operate dangerous machinery, swim alone, or climb in high or dangerous places, such as on ladders, roofs, or girders.  Do not drive unless your doctor says you may.  3. If you have any warning that you may have a seizure, lay down in a safe place where you can't hurt yourself.    4.  No driving for 6 months from last seizure, as per [facility].   Please refer to the following link on the Epilepsy Foundation of [employer]'s website for more information: http://www.epilepsyfoundation.org/answerplace/Social/driving/drivingu.cfm   5.  Maintain good sleep hygiene. Avoid alcohol.  6.  Notify your neurology if you are planning pregnancy or if you become pregnant.  7.  Contact your doctor if you have any problems that may be related to the medicine you are taking.  8.  Call 911 and bring the patient back to the ED if:        A.  The seizure lasts longer than 5 minutes.       B.  The patient doesn't awaken shortly after the seizure  C.  The patient has new problems such as difficulty seeing, speaking or moving  D.  The patient was injured during the seizure  E.  The patient has a temperature over 102 F (39C)  F.  The patient vomited and now is having trouble breathing
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
## 2023-02-21 ENCOUNTER — Other Ambulatory Visit (INDEPENDENT_AMBULATORY_CARE_PROVIDER_SITE_OTHER): Payer: BC Managed Care – PPO

## 2023-02-21 DIAGNOSIS — G43009 Migraine without aura, not intractable, without status migrainosus: Secondary | ICD-10-CM

## 2023-02-21 DIAGNOSIS — G40009 Localization-related (focal) (partial) idiopathic epilepsy and epileptic syndromes with seizures of localized onset, not intractable, without status epilepticus: Secondary | ICD-10-CM | POA: Diagnosis not present

## 2023-02-21 DIAGNOSIS — Z0279 Encounter for issue of other medical certificate: Secondary | ICD-10-CM

## 2023-02-24 LAB — TOPIRAMATE LEVEL: Topiramate Lvl: 14.3 ug/mL

## 2023-03-01 ENCOUNTER — Telehealth: Payer: Self-pay | Admitting: Neurology

## 2023-03-01 NOTE — Telephone Encounter (Signed)
Pt called in wanting an update on the FMLA paperwork. There is a deadline of 03/03/23.

## 2023-03-01 NOTE — Telephone Encounter (Signed)
Done, thanks

## 2023-03-02 MED ORDER — TOPIRAMATE 200 MG PO TABS
ORAL_TABLET | ORAL | 3 refills | Status: DC
Start: 2023-03-02 — End: 2023-05-25

## 2023-03-02 NOTE — Addendum Note (Signed)
Addended by: Van Clines on: 03/02/2023 01:59 PM   Modules accepted: Orders

## 2023-03-02 NOTE — Telephone Encounter (Signed)
Pt called informed that paperwork is ready I will fax it in today

## 2023-03-03 ENCOUNTER — Telehealth: Payer: Self-pay

## 2023-03-03 NOTE — Telephone Encounter (Signed)
-----   Message from Van Clines, MD sent at 03/02/2023  1:59 PM EDT ----- Pls let her know Topamax level came back, there is a little wiggle room for her medication to be increased. Pls have her take Topamax 200mg : 1 tablet in AM, 1 and 1/2 tablets in PM. I sent in updated Rx to [facility], thanks

## 2023-03-03 NOTE — Telephone Encounter (Signed)
Pt called an informed Topamax level came back, there is a little wiggle room for her medication to be increased. Pls have her take Topamax 200mg : 1 tablet in AM, 1 and 1/2 tablets in PM. I sent in updated Rx to [facility]

## 2023-05-12 ENCOUNTER — Telehealth: Payer: Self-pay | Admitting: Neurology

## 2023-05-12 MED ORDER — LACOSAMIDE 100 MG PO TABS: ORAL_TABLET | ORAL | 5 refills | Status: DC

## 2023-05-12 NOTE — Telephone Encounter (Signed)
Called patient and she stated that she has had 3 episodes (seizures) in the last 2 weeks.  Patient also states that she just found out she is [redacted] weeks pregnant. She states she feels off and feels like another seizure can come on at any time. Asked patient questions below:  Missed medications?  No.Current medications she takes Topamax 200mg : 1 tablet in AM, 1 and 1/2 tablets in PM and has not missed any doses. Sleep deprived?  No Alcohol intake?  No  Back to their usual baseline self? No, had to leave work yesterday due to my blood pressure being really high. This morning patient reports her blood pressure was high at 186/53.

## 2023-05-12 NOTE — Telephone Encounter (Signed)
Called patient and informed her of reccommendations from Dr. Karel Jarvis:  "Pls send her my congratulations for her pregnancy. Also, Topamax is not recommended during pregnancy. Pls let her know that I remember she had side effects on Keppra but the safest medications for pregnancy are Keppra and Lamictal. Lamictal takes several weeks to get in her system, we can't start full dose right away. Please tell her that temporarily to keep her covered, I would restart Keppra 1000mg  BID, and start reducing the Topamax 1 tab in AM, 1 tab in PM for 1 week, then 1/2 tab twice a day for 1 week, then 1/2 tab daily for 1 week, then stop. I will be seeing her before she fully stops it (f/u 8/14) and discuss further plans. Pls send in Rx for Keppra. Also make sure she sees OB/PCP for the high BP in pregnancy. Thanks"     Patient stated that when she took keppra in the past it made her physically sick. I did explain again to patient that Keppra is the safest for her pregnancy, which is why Dr. Karel Jarvis would like her to wean off the Topamax while starting the Keppra. Patient wanted me to send Dr. Karel Jarvis back a message letting her know that Keppra makes her physically sick.

## 2023-05-12 NOTE — Telephone Encounter (Signed)
Pls send her my congratulations for her pregnancy. Also, Topamax is not recommended during pregnancy. Pls let her know that I remember she had side effects on Keppra but the safest medications for pregnancy are Keppra and Lamictal. Lamictal takes several weeks to get in her system, we can't start full dose right away. Please tell her that temporarily to keep her covered, I would restart Keppra 1000mg  BID, and start reducing the Topamax 1 tab in AM, 1 tab in PM for 1 week, then 1/2 tab twice a day for 1 week, then 1/2 tab daily for 1 week, then stop. I will be seeing her before she fully stops it (f/u 8/14) and discuss further plans. Pls send in Rx for Keppra. Also make sure she sees OB/PCP for the high BP in pregnancy. Thanks

## 2023-05-12 NOTE — Telephone Encounter (Signed)
Patient is calling to speak with nurse, patient is having multiple episodes, she also just found out she is pregnant and is worried  bout how the seizures are effecting her.

## 2023-05-12 NOTE — Telephone Encounter (Signed)
Pls let her know I understand, would do Lacosamide then. It is category C for pregnancy because it does not have as much data as the Keppra and Lamictal, but at this point studies have not indicated increased risks of major birth defects or spontaneous abortion. Side effects may be dizziness, blurred vision, but usually at higher doses. Start Lacosamide 100mg  BID 1/2 tablet twice a day for 1 week, then increase to 1 tablet twice a day and continue. For the Topamax, reduce to 1 tab BID for 1 week, then 1/2 tab BID. I will be seeing her at that point. I sent in Rx for Lacosamide to pharmacy on file, [facility] on [address]. Thanks

## 2023-05-12 NOTE — Telephone Encounter (Signed)
Called patient and informed her of Dr. Elson Clan advice:  "Pls let her know I understand, would do Lacosamide then. It is category C for pregnancy because it does not have as much data as the Keppra and Lamictal, but at this point studies have not indicated increased risks of major birth defects or spontaneous abortion. Side effects may be dizziness, blurred vision, but usually at higher doses. Start Lacosamide 100mg  BID 1/2 tablet twice a day for 1 week, then increase to 1 tablet twice a day and continue. For the Topamax, reduce to 1 tab BID for 1 week, then 1/2 tab BID. I will be seeing her at that point. I sent in Rx for Lacosamide to pharmacy on file, [facility] on [address]. Thanks"  Patient verbalized understanding and is aware the new Rx was sent in for her. Patient had no questions and understood all directions to starting new medication and weaning off Topamax.

## 2023-05-25 ENCOUNTER — Encounter: Payer: Self-pay | Admitting: Neurology

## 2023-05-25 ENCOUNTER — Ambulatory Visit (INDEPENDENT_AMBULATORY_CARE_PROVIDER_SITE_OTHER): Payer: BC Managed Care – PPO | Admitting: Neurology

## 2023-05-25 VITALS — BP 136/84 | HR 65 | Ht 66.0 in | Wt 277.8 lb

## 2023-05-25 DIAGNOSIS — O99351 Diseases of the nervous system complicating pregnancy, first trimester: Secondary | ICD-10-CM | POA: Diagnosis not present

## 2023-05-25 DIAGNOSIS — Z3A12 12 weeks gestation of pregnancy: Secondary | ICD-10-CM

## 2023-05-25 DIAGNOSIS — G40009 Localization-related (focal) (partial) idiopathic epilepsy and epileptic syndromes with seizures of localized onset, not intractable, without status epilepticus: Secondary | ICD-10-CM | POA: Diagnosis not present

## 2023-05-25 DIAGNOSIS — G40909 Epilepsy, unspecified, not intractable, without status epilepticus: Secondary | ICD-10-CM | POA: Diagnosis not present

## 2023-05-25 MED ORDER — TOPIRAMATE 200 MG PO TABS: ORAL_TABLET | ORAL | Status: DC

## 2023-05-25 MED ORDER — OXCARBAZEPINE 300 MG PO TABS: ORAL_TABLET | ORAL | 6 refills | Status: DC
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
## 2023-05-27 ENCOUNTER — Encounter: Payer: Self-pay | Admitting: Neurology

## 2023-06-08 ENCOUNTER — Other Ambulatory Visit: Payer: Self-pay

## 2023-06-08 NOTE — Progress Notes (Signed)
Error

## 2023-06-13 HISTORY — PX: KNEE SURGERY: SHX244

## 2023-06-15 ENCOUNTER — Encounter: Payer: BC Managed Care – PPO | Admitting: Family Medicine

## 2023-06-15 HISTORY — PX: OTHER SURGICAL HISTORY: SHX169

## 2023-06-19 ENCOUNTER — Emergency Department (HOSPITAL_BASED_OUTPATIENT_CLINIC_OR_DEPARTMENT_OTHER)
Admission: EM | Admit: 2023-06-19 | Discharge: 2023-06-19 | Disposition: A | Discharge status: Home / Self Care | Payer: BC Managed Care – PPO | Attending: Emergency Medicine | Admitting: Emergency Medicine

## 2023-06-19 ENCOUNTER — Emergency Department (HOSPITAL_BASED_OUTPATIENT_CLINIC_OR_DEPARTMENT_OTHER): Payer: BC Managed Care – PPO

## 2023-06-19 ENCOUNTER — Other Ambulatory Visit: Payer: Self-pay

## 2023-06-19 ENCOUNTER — Encounter (HOSPITAL_BASED_OUTPATIENT_CLINIC_OR_DEPARTMENT_OTHER): Payer: Self-pay | Admitting: Emergency Medicine

## 2023-06-19 DIAGNOSIS — S3991XA Unspecified injury of abdomen, initial encounter: Secondary | ICD-10-CM | POA: Diagnosis present

## 2023-06-19 DIAGNOSIS — Y92410 Unspecified street and highway as the place of occurrence of the external cause: Secondary | ICD-10-CM | POA: Insufficient documentation

## 2023-06-19 DIAGNOSIS — S301XXA Contusion of abdominal wall, initial encounter: Secondary | ICD-10-CM | POA: Insufficient documentation

## 2023-06-19 LAB — URINALYSIS, ROUTINE W REFLEX MICROSCOPIC
Bilirubin Urine: NEGATIVE
Glucose, UA: NEGATIVE mg/dL
Hgb urine dipstick: NEGATIVE
Ketones, ur: NEGATIVE mg/dL
Leukocytes,Ua: NEGATIVE
Nitrite: NEGATIVE
Protein, ur: NEGATIVE mg/dL
Specific Gravity, Urine: 1.025 (ref 1.005–1.030)
pH: 6 (ref 5.0–8.0)

## 2023-06-19 LAB — CBC WITH DIFFERENTIAL/PLATELET
Abs Immature Granulocytes: 0.04 10*3/uL (ref 0.00–0.07)
Basophils Absolute: 0 10*3/uL (ref 0.0–0.1)
Basophils Relative: 0 %
Eosinophils Absolute: 0.2 10*3/uL (ref 0.0–0.5)
Eosinophils Relative: 3 %
HCT: 30.2 % — ABNORMAL LOW (ref 36.0–46.0)
Hemoglobin: 9.8 g/dL — ABNORMAL LOW (ref 12.0–15.0)
Immature Granulocytes: 1 %
Lymphocytes Relative: 19 %
Lymphs Abs: 1.6 10*3/uL (ref 0.7–4.0)
MCH: 31.3 pg (ref 26.0–34.0)
MCHC: 32.5 g/dL (ref 30.0–36.0)
MCV: 96.5 fL (ref 80.0–100.0)
Monocytes Absolute: 0.7 10*3/uL (ref 0.1–1.0)
Monocytes Relative: 9 %
Neutro Abs: 5.8 10*3/uL (ref 1.7–7.7)
Neutrophils Relative %: 68 %
Platelets: 261 10*3/uL (ref 150–400)
RBC: 3.13 MIL/uL — ABNORMAL LOW (ref 3.87–5.11)
RDW: 13.5 % (ref 11.5–15.5)
WBC: 8.5 10*3/uL (ref 4.0–10.5)
nRBC: 0 % (ref 0.0–0.2)

## 2023-06-19 LAB — COMPREHENSIVE METABOLIC PANEL
ALT: 15 U/L (ref 0–44)
AST: 19 U/L (ref 15–41)
Albumin: 3.4 g/dL — ABNORMAL LOW (ref 3.5–5.0)
Alkaline Phosphatase: 53 U/L (ref 38–126)
Anion gap: 9 (ref 5–15)
BUN: 7 mg/dL (ref 6–20)
CO2: 19 mmol/L — ABNORMAL LOW (ref 22–32)
Calcium: 9 mg/dL (ref 8.9–10.3)
Chloride: 104 mmol/L (ref 98–111)
Creatinine, Ser: 0.79 mg/dL (ref 0.44–1.00)
GFR, Estimated: >60 mL/min (ref >60)
Glucose, Bld: 110 mg/dL — ABNORMAL HIGH (ref 70–99)
Potassium: 4 mmol/L (ref 3.5–5.1)
Sodium: 132 mmol/L — ABNORMAL LOW (ref 135–145)
Total Bilirubin: 0.5 mg/dL (ref 0.3–1.2)
Total Protein: 7.2 g/dL (ref 6.5–8.1)

## 2023-06-19 LAB — LIPASE, BLOOD: Lipase: 19 U/L (ref 11–51)

## 2023-06-19 MED ORDER — ONDANSETRON HCL 4 MG/2ML IJ SOLN
4.0000 mg | Freq: Once | INTRAMUSCULAR | Status: DC
  Filled 2023-06-19: qty 2
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
## 2023-06-19 NOTE — ED Notes (Signed)
Kerlix to RLE rewrapped per pt request
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
## 2023-07-05 ENCOUNTER — Telehealth (INDEPENDENT_AMBULATORY_CARE_PROVIDER_SITE_OTHER): Payer: BC Managed Care – PPO | Admitting: Neurology

## 2023-07-05 ENCOUNTER — Encounter: Payer: Self-pay | Admitting: Neurology

## 2023-07-05 DIAGNOSIS — G40909 Epilepsy, unspecified, not intractable, without status epilepticus: Secondary | ICD-10-CM

## 2023-07-05 DIAGNOSIS — G40009 Localization-related (focal) (partial) idiopathic epilepsy and epileptic syndromes with seizures of localized onset, not intractable, without status epilepticus: Secondary | ICD-10-CM

## 2023-07-05 DIAGNOSIS — G43009 Migraine without aura, not intractable, without status migrainosus: Secondary | ICD-10-CM | POA: Diagnosis not present

## 2023-07-11 NOTE — Patient Instructions (Signed)
Continue all your medications for now. Please go to the Maternal-Fetal ER for evaluation. We will discuss medications after you have been evaluated.

## 2023-07-18 ENCOUNTER — Telehealth: Payer: BC Managed Care – PPO

## 2023-07-18 VITALS — BP 130/78

## 2023-07-18 DIAGNOSIS — O0992 Supervision of high risk pregnancy, unspecified, second trimester: Secondary | ICD-10-CM

## 2023-07-18 DIAGNOSIS — Z3A19 19 weeks gestation of pregnancy: Secondary | ICD-10-CM

## 2023-07-18 DIAGNOSIS — O0990 Supervision of high risk pregnancy, unspecified, unspecified trimester: Secondary | ICD-10-CM | POA: Insufficient documentation

## 2023-07-18 NOTE — Progress Notes (Signed)
Connected with patient via phone for New OB intake. Jamie Archer l [name], CMA

## 2023-07-25 ENCOUNTER — Ambulatory Visit: Payer: BC Managed Care – PPO | Admitting: Obstetrics & Gynecology

## 2023-07-25 VITALS — BP 128/86 | HR 58 | Wt 269.0 lb

## 2023-07-25 DIAGNOSIS — G40909 Epilepsy, unspecified, not intractable, without status epilepticus: Secondary | ICD-10-CM | POA: Insufficient documentation

## 2023-07-25 DIAGNOSIS — O0992 Supervision of high risk pregnancy, unspecified, second trimester: Secondary | ICD-10-CM | POA: Diagnosis not present

## 2023-07-25 DIAGNOSIS — Z1339 Encounter for screening examination for other mental health and behavioral disorders: Secondary | ICD-10-CM

## 2023-07-25 DIAGNOSIS — O99210 Obesity complicating pregnancy, unspecified trimester: Secondary | ICD-10-CM

## 2023-07-25 DIAGNOSIS — O99212 Obesity complicating pregnancy, second trimester: Secondary | ICD-10-CM | POA: Diagnosis not present

## 2023-07-25 DIAGNOSIS — O99352 Diseases of the nervous system complicating pregnancy, second trimester: Secondary | ICD-10-CM | POA: Diagnosis not present

## 2023-07-25 DIAGNOSIS — O0990 Supervision of high risk pregnancy, unspecified, unspecified trimester: Secondary | ICD-10-CM

## 2023-07-25 DIAGNOSIS — Z3A20 20 weeks gestation of pregnancy: Secondary | ICD-10-CM

## 2023-07-25 MED ORDER — PANTOPRAZOLE SODIUM 20 MG PO TBEC
20.0000 mg | DELAYED_RELEASE_TABLET | Freq: Every day | ORAL | 4 refills | Status: DC
Start: 2023-07-25 — End: 2023-12-03

## 2023-07-25 NOTE — Progress Notes (Signed)
Pt would like medication for heartburn.
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
## 2023-07-27 LAB — CBC/D/PLT+RPR+RH+ABO+RUBIGG...
Antibody Screen: NEGATIVE
Basophils Absolute: 0 10*3/uL (ref 0.0–0.2)
Basos: 0 %
EOS (ABSOLUTE): 0.1 10*3/uL (ref 0.0–0.4)
Eos: 1 %
HCV Ab: NONREACTIVE
HIV Screen 4th Generation wRfx: NONREACTIVE
Hematocrit: 32 % — ABNORMAL LOW (ref 34.0–46.6)
Hemoglobin: 10.4 g/dL — ABNORMAL LOW (ref 11.1–15.9)
Hepatitis B Surface Ag: NEGATIVE
Immature Grans (Abs): 0 10*3/uL (ref 0.0–0.1)
Immature Granulocytes: 0 %
Lymphocytes Absolute: 1.8 10*3/uL (ref 0.7–3.1)
Lymphs: 28 %
MCH: 32.1 pg (ref 26.6–33.0)
MCHC: 32.5 g/dL (ref 31.5–35.7)
MCV: 99 fL — ABNORMAL HIGH (ref 79–97)
Monocytes Absolute: 0.4 10*3/uL (ref 0.1–0.9)
Monocytes: 6 %
Neutrophils Absolute: 4.1 10*3/uL (ref 1.4–7.0)
Neutrophils: 65 %
Platelets: 268 10*3/uL (ref 150–450)
RBC: 3.24 x10E6/uL — ABNORMAL LOW (ref 3.77–5.28)
RDW: 12.6 % (ref 11.7–15.4)
RPR Ser Ql: NONREACTIVE
Rh Factor: POSITIVE
Rubella Antibodies, IGG: <0.9 {index} — ABNORMAL LOW (ref >0.99)
WBC: 6.5 10*3/uL (ref 3.4–10.8)

## 2023-07-27 LAB — AFP, SERUM, OPEN SPINA BIFIDA
AFP MoM: 1.73
AFP Value: 68.8 ng/mL
Gest. Age on Collection Date: 20 wk
Maternal Age At EDD: 34.1 a
OSBR Risk 1 IN: 1514
Test Results:: NEGATIVE
Weight: 269 [lb_av]

## 2023-07-27 LAB — HCV INTERPRETATION

## 2023-08-17 ENCOUNTER — Ambulatory Visit: Payer: BC Managed Care – PPO | Attending: Obstetrics & Gynecology

## 2023-08-17 ENCOUNTER — Other Ambulatory Visit: Payer: Self-pay

## 2023-08-17 ENCOUNTER — Ambulatory Visit: Payer: BC Managed Care – PPO | Admitting: *Deleted

## 2023-08-17 VITALS — BP 116/48 | HR 84

## 2023-08-17 DIAGNOSIS — O0990 Supervision of high risk pregnancy, unspecified, unspecified trimester: Secondary | ICD-10-CM | POA: Insufficient documentation

## 2023-08-17 DIAGNOSIS — O99352 Diseases of the nervous system complicating pregnancy, second trimester: Secondary | ICD-10-CM | POA: Diagnosis not present

## 2023-08-17 DIAGNOSIS — G40909 Epilepsy, unspecified, not intractable, without status epilepticus: Secondary | ICD-10-CM

## 2023-08-17 DIAGNOSIS — Z3A23 23 weeks gestation of pregnancy: Secondary | ICD-10-CM

## 2023-08-17 DIAGNOSIS — O99212 Obesity complicating pregnancy, second trimester: Secondary | ICD-10-CM | POA: Diagnosis not present

## 2023-08-17 DIAGNOSIS — E669 Obesity, unspecified: Secondary | ICD-10-CM

## 2023-08-19 ENCOUNTER — Other Ambulatory Visit: Payer: Self-pay | Admitting: *Deleted

## 2023-08-19 DIAGNOSIS — O99210 Obesity complicating pregnancy, unspecified trimester: Secondary | ICD-10-CM

## 2023-08-25 ENCOUNTER — Ambulatory Visit: Payer: BC Managed Care – PPO | Admitting: Family Medicine

## 2023-08-25 VITALS — BP 138/75 | HR 81 | Wt 270.0 lb

## 2023-08-25 DIAGNOSIS — Z3A25 25 weeks gestation of pregnancy: Secondary | ICD-10-CM

## 2023-08-25 DIAGNOSIS — O0990 Supervision of high risk pregnancy, unspecified, unspecified trimester: Secondary | ICD-10-CM

## 2023-08-25 DIAGNOSIS — O99350 Diseases of the nervous system complicating pregnancy, unspecified trimester: Secondary | ICD-10-CM

## 2023-08-25 DIAGNOSIS — G40909 Epilepsy, unspecified, not intractable, without status epilepticus: Secondary | ICD-10-CM
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
## 2023-08-27 LAB — URINE CULTURE, OB REFLEX

## 2023-08-27 LAB — CULTURE, OB URINE

## 2023-09-14 ENCOUNTER — Other Ambulatory Visit: Payer: Self-pay

## 2023-09-14 ENCOUNTER — Other Ambulatory Visit: Payer: Self-pay | Admitting: *Deleted

## 2023-09-14 ENCOUNTER — Ambulatory Visit: Payer: BC Managed Care – PPO | Attending: Obstetrics

## 2023-09-14 DIAGNOSIS — O99352 Diseases of the nervous system complicating pregnancy, second trimester: Secondary | ICD-10-CM

## 2023-09-14 DIAGNOSIS — O365920 Maternal care for other known or suspected poor fetal growth, second trimester, not applicable or unspecified: Secondary | ICD-10-CM

## 2023-09-14 DIAGNOSIS — G40909 Epilepsy, unspecified, not intractable, without status epilepticus: Secondary | ICD-10-CM

## 2023-09-14 DIAGNOSIS — O99212 Obesity complicating pregnancy, second trimester: Secondary | ICD-10-CM

## 2023-09-14 DIAGNOSIS — Z3A27 27 weeks gestation of pregnancy: Secondary | ICD-10-CM

## 2023-09-14 DIAGNOSIS — O99210 Obesity complicating pregnancy, unspecified trimester: Secondary | ICD-10-CM | POA: Insufficient documentation

## 2023-09-20 ENCOUNTER — Ambulatory Visit (INDEPENDENT_AMBULATORY_CARE_PROVIDER_SITE_OTHER): Payer: BC Managed Care – PPO

## 2023-09-20 VITALS — BP 129/70 | HR 68 | Wt 288.0 lb

## 2023-09-20 DIAGNOSIS — Z3A28 28 weeks gestation of pregnancy: Secondary | ICD-10-CM

## 2023-09-20 DIAGNOSIS — O0990 Supervision of high risk pregnancy, unspecified, unspecified trimester: Secondary | ICD-10-CM

## 2023-09-20 DIAGNOSIS — O99210 Obesity complicating pregnancy, unspecified trimester: Secondary | ICD-10-CM

## 2023-09-20 DIAGNOSIS — O99213 Obesity complicating pregnancy, third trimester: Secondary | ICD-10-CM
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
## 2023-09-21 LAB — GLUCOSE TOLERANCE, 2 HOURS W/ 1HR
Glucose, 1 hour: 99 mg/dL (ref 70–179)
Glucose, 2 hour: 115 mg/dL (ref 70–152)
Glucose, Fasting: 83 mg/dL (ref 70–91)

## 2023-09-21 LAB — CBC
Hematocrit: 30.7 % — ABNORMAL LOW (ref 34.0–46.6)
Hemoglobin: 10.1 g/dL — ABNORMAL LOW (ref 11.1–15.9)
MCH: 32.2 pg (ref 26.6–33.0)
MCHC: 32.9 g/dL (ref 31.5–35.7)
MCV: 98 fL — ABNORMAL HIGH (ref 79–97)
Platelets: 294 10*3/uL (ref 150–450)
RBC: 3.14 x10E6/uL — ABNORMAL LOW (ref 3.77–5.28)
RDW: 11.9 % (ref 11.7–15.4)
WBC: 7.5 10*3/uL (ref 3.4–10.8)

## 2023-09-21 LAB — HIV ANTIBODY (ROUTINE TESTING W REFLEX): HIV Screen 4th Generation wRfx: NONREACTIVE

## 2023-09-21 LAB — RPR: RPR Ser Ql: NONREACTIVE

## 2023-09-28 ENCOUNTER — Telehealth: Payer: Self-pay | Admitting: Neurology

## 2023-09-28 DIAGNOSIS — Z0279 Encounter for issue of other medical certificate: Secondary | ICD-10-CM

## 2023-09-28 NOTE — Telephone Encounter (Signed)
Received FMLA paperwork. $25 form fee has been paid. Patient would like it faxed in once completed. She stated they need it by 10/02/23. I let her know we would try, but it could take up to 2 weeks.

## 2023-09-30 NOTE — Telephone Encounter (Signed)
Done, thanks

## 2023-09-30 NOTE — Telephone Encounter (Signed)
Pt called no answer left a voice mail paper work was faxed

## 2023-10-01 DIAGNOSIS — O09899 Supervision of other high risk pregnancies, unspecified trimester: Secondary | ICD-10-CM | POA: Insufficient documentation

## 2023-10-01 DIAGNOSIS — Z2839 Other underimmunization status: Secondary | ICD-10-CM | POA: Insufficient documentation

## 2023-10-01 MED ORDER — FERROUS SULFATE 325 (65 FE) MG PO TBEC
325.0000 mg | DELAYED_RELEASE_TABLET | ORAL | 1 refills | Status: DC
Start: 2023-10-01 — End: 2024-01-19

## 2023-10-01 NOTE — Addendum Note (Signed)
Addended by: Gerrit Heck L on: 10/01/2023 04:40 AM   Modules accepted: Orders

## 2023-10-06 ENCOUNTER — Telehealth: Payer: Self-pay | Admitting: Neurology

## 2023-10-06 NOTE — Telephone Encounter (Signed)
Patient said the [employer] forms were not sent in correctly, so she will be sending the forms over again. Page 7 was not filled out at all per Vilda. The forms need to be sent back in by 10/15/23

## 2023-10-14 NOTE — Telephone Encounter (Signed)
 Done, thanks

## 2023-10-19 ENCOUNTER — Ambulatory Visit: Payer: BC Managed Care – PPO | Admitting: Family Medicine

## 2023-10-19 ENCOUNTER — Other Ambulatory Visit: Payer: Self-pay | Admitting: *Deleted

## 2023-10-19 ENCOUNTER — Ambulatory Visit: Payer: BC Managed Care – PPO | Admitting: *Deleted

## 2023-10-19 ENCOUNTER — Ambulatory Visit: Payer: BC Managed Care – PPO | Attending: Maternal & Fetal Medicine

## 2023-10-19 VITALS — BP 133/70 | HR 57

## 2023-10-19 VITALS — BP 133/73 | HR 57 | Wt 290.0 lb

## 2023-10-19 DIAGNOSIS — O99212 Obesity complicating pregnancy, second trimester: Secondary | ICD-10-CM | POA: Diagnosis present

## 2023-10-19 DIAGNOSIS — Z2839 Other underimmunization status: Secondary | ICD-10-CM | POA: Insufficient documentation

## 2023-10-19 DIAGNOSIS — O99352 Diseases of the nervous system complicating pregnancy, second trimester: Secondary | ICD-10-CM | POA: Diagnosis not present

## 2023-10-19 DIAGNOSIS — G40909 Epilepsy, unspecified, not intractable, without status epilepticus: Secondary | ICD-10-CM | POA: Diagnosis not present

## 2023-10-19 DIAGNOSIS — O99213 Obesity complicating pregnancy, third trimester: Secondary | ICD-10-CM

## 2023-10-19 DIAGNOSIS — O365930 Maternal care for other known or suspected poor fetal growth, third trimester, not applicable or unspecified: Secondary | ICD-10-CM | POA: Diagnosis not present

## 2023-10-19 DIAGNOSIS — E669 Obesity, unspecified: Secondary | ICD-10-CM

## 2023-10-19 DIAGNOSIS — O99353 Diseases of the nervous system complicating pregnancy, third trimester: Secondary | ICD-10-CM

## 2023-10-19 DIAGNOSIS — Z23 Encounter for immunization: Secondary | ICD-10-CM

## 2023-10-19 DIAGNOSIS — O0990 Supervision of high risk pregnancy, unspecified, unspecified trimester: Secondary | ICD-10-CM | POA: Insufficient documentation

## 2023-10-19 DIAGNOSIS — Z3A32 32 weeks gestation of pregnancy: Secondary | ICD-10-CM

## 2023-10-19 DIAGNOSIS — O09893 Supervision of other high risk pregnancies, third trimester: Secondary | ICD-10-CM

## 2023-10-19 DIAGNOSIS — O09899 Supervision of other high risk pregnancies, unspecified trimester: Secondary | ICD-10-CM | POA: Diagnosis present

## 2023-10-19 DIAGNOSIS — O0993 Supervision of high risk pregnancy, unspecified, third trimester: Secondary | ICD-10-CM

## 2023-10-19 DIAGNOSIS — O99210 Obesity complicating pregnancy, unspecified trimester: Secondary | ICD-10-CM
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
## 2023-11-15 ENCOUNTER — Encounter: Payer: Self-pay | Admitting: Nurse Practitioner

## 2023-11-15 ENCOUNTER — Ambulatory Visit (INDEPENDENT_AMBULATORY_CARE_PROVIDER_SITE_OTHER): Payer: BC Managed Care – PPO | Admitting: Nurse Practitioner

## 2023-11-15 ENCOUNTER — Other Ambulatory Visit (HOSPITAL_COMMUNITY)
Admission: RE | Admit: 2023-11-15 | Discharge: 2023-11-15 | Disposition: A | Discharge status: Home / Self Care | Payer: BC Managed Care – PPO | Source: Ambulatory Visit | Attending: Nurse Practitioner | Admitting: Nurse Practitioner

## 2023-11-15 VITALS — BP 139/80 | HR 76 | Wt 298.0 lb

## 2023-11-15 DIAGNOSIS — Z3A36 36 weeks gestation of pregnancy: Secondary | ICD-10-CM | POA: Insufficient documentation

## 2023-11-15 DIAGNOSIS — O0990 Supervision of high risk pregnancy, unspecified, unspecified trimester: Secondary | ICD-10-CM | POA: Diagnosis present

## 2023-11-15 DIAGNOSIS — O99210 Obesity complicating pregnancy, unspecified trimester: Secondary | ICD-10-CM

## 2023-11-15 DIAGNOSIS — O09899 Supervision of other high risk pregnancies, unspecified trimester: Secondary | ICD-10-CM

## 2023-11-15 DIAGNOSIS — O0993 Supervision of high risk pregnancy, unspecified, third trimester: Secondary | ICD-10-CM | POA: Diagnosis not present

## 2023-11-15 DIAGNOSIS — G40909 Epilepsy, unspecified, not intractable, without status epilepticus: Secondary | ICD-10-CM

## 2023-11-15 DIAGNOSIS — Z2839 Other underimmunization status: Secondary | ICD-10-CM

## 2023-11-15 DIAGNOSIS — O99350 Diseases of the nervous system complicating pregnancy, unspecified trimester: Secondary | ICD-10-CM
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
## 2023-11-16 ENCOUNTER — Ambulatory Visit (HOSPITAL_BASED_OUTPATIENT_CLINIC_OR_DEPARTMENT_OTHER): Payer: BC Managed Care – PPO

## 2023-11-16 ENCOUNTER — Ambulatory Visit: Payer: BC Managed Care – PPO | Attending: Maternal & Fetal Medicine | Admitting: *Deleted

## 2023-11-16 ENCOUNTER — Ambulatory Visit (HOSPITAL_BASED_OUTPATIENT_CLINIC_OR_DEPARTMENT_OTHER): Payer: BC Managed Care – PPO | Admitting: Maternal & Fetal Medicine

## 2023-11-16 VITALS — BP 128/82 | HR 71

## 2023-11-16 DIAGNOSIS — Z3A36 36 weeks gestation of pregnancy: Secondary | ICD-10-CM

## 2023-11-16 DIAGNOSIS — G40909 Epilepsy, unspecified, not intractable, without status epilepticus: Secondary | ICD-10-CM | POA: Diagnosis not present

## 2023-11-16 DIAGNOSIS — O403XX0 Polyhydramnios, third trimester, not applicable or unspecified: Secondary | ICD-10-CM

## 2023-11-16 DIAGNOSIS — O0990 Supervision of high risk pregnancy, unspecified, unspecified trimester: Secondary | ICD-10-CM

## 2023-11-16 DIAGNOSIS — O09899 Supervision of other high risk pregnancies, unspecified trimester: Secondary | ICD-10-CM

## 2023-11-16 DIAGNOSIS — O99213 Obesity complicating pregnancy, third trimester: Secondary | ICD-10-CM | POA: Diagnosis present

## 2023-11-16 DIAGNOSIS — O99353 Diseases of the nervous system complicating pregnancy, third trimester: Secondary | ICD-10-CM

## 2023-11-16 DIAGNOSIS — O365990 Maternal care for other known or suspected poor fetal growth, unspecified trimester, not applicable or unspecified: Secondary | ICD-10-CM | POA: Diagnosis not present

## 2023-11-16 DIAGNOSIS — O99350 Diseases of the nervous system complicating pregnancy, unspecified trimester: Secondary | ICD-10-CM

## 2023-11-16 DIAGNOSIS — E669 Obesity, unspecified: Secondary | ICD-10-CM

## 2023-11-16 DIAGNOSIS — O409XX0 Polyhydramnios, unspecified trimester, not applicable or unspecified: Secondary | ICD-10-CM | POA: Diagnosis not present

## 2023-11-16 LAB — GC/CHLAMYDIA PROBE AMP (~~LOC~~) NOT AT ARMC
Chlamydia: NEGATIVE
Comment: NEGATIVE
Comment: NORMAL
Neisseria Gonorrhea: NEGATIVE
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
## 2023-11-19 LAB — CULTURE, BETA STREP (GROUP B ONLY): Strep Gp B Culture: NEGATIVE

## 2023-11-23 ENCOUNTER — Ambulatory Visit: Payer: BC Managed Care – PPO | Admitting: Family Medicine

## 2023-11-23 VITALS — BP 134/89 | HR 61 | Wt 308.0 lb

## 2023-11-23 DIAGNOSIS — Z2839 Other underimmunization status: Secondary | ICD-10-CM

## 2023-11-23 DIAGNOSIS — O99350 Diseases of the nervous system complicating pregnancy, unspecified trimester: Secondary | ICD-10-CM | POA: Diagnosis not present

## 2023-11-23 DIAGNOSIS — O09899 Supervision of other high risk pregnancies, unspecified trimester: Secondary | ICD-10-CM | POA: Diagnosis not present

## 2023-11-23 DIAGNOSIS — G40909 Epilepsy, unspecified, not intractable, without status epilepticus: Secondary | ICD-10-CM

## 2023-11-23 DIAGNOSIS — O0990 Supervision of high risk pregnancy, unspecified, unspecified trimester: Secondary | ICD-10-CM | POA: Diagnosis not present

## 2023-11-23 DIAGNOSIS — O409XX0 Polyhydramnios, unspecified trimester, not applicable or unspecified: Secondary | ICD-10-CM
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
## 2023-11-24 ENCOUNTER — Telehealth (HOSPITAL_COMMUNITY): Payer: Self-pay | Admitting: *Deleted

## 2023-11-24 ENCOUNTER — Encounter (HOSPITAL_COMMUNITY): Payer: Self-pay

## 2023-11-24 NOTE — Telephone Encounter (Signed)
Preadmission screen

## 2023-11-25 ENCOUNTER — Encounter (HOSPITAL_COMMUNITY): Payer: Self-pay | Admitting: *Deleted

## 2023-11-25 ENCOUNTER — Telehealth (HOSPITAL_COMMUNITY): Payer: Self-pay | Admitting: *Deleted

## 2023-11-25 NOTE — Telephone Encounter (Signed)
Preadmission screen

## 2023-11-30 ENCOUNTER — Other Ambulatory Visit: Payer: Self-pay

## 2023-11-30 ENCOUNTER — Inpatient Hospital Stay (HOSPITAL_COMMUNITY)
Admission: AD | Admit: 2023-11-30 | Discharge: 2023-12-03 | DRG: 787 | Disposition: A | Discharge status: Home / Self Care | Payer: BC Managed Care – PPO | Attending: Obstetrics and Gynecology | Admitting: Obstetrics and Gynecology

## 2023-11-30 ENCOUNTER — Encounter (HOSPITAL_COMMUNITY): Payer: Self-pay | Admitting: Obstetrics and Gynecology

## 2023-11-30 ENCOUNTER — Ambulatory Visit: Payer: BC Managed Care – PPO | Admitting: Family Medicine

## 2023-11-30 ENCOUNTER — Inpatient Hospital Stay (HOSPITAL_COMMUNITY): Payer: BC Managed Care – PPO | Admitting: Anesthesiology

## 2023-11-30 VITALS — BP 161/99 | HR 50 | Wt 316.0 lb

## 2023-11-30 DIAGNOSIS — Z881 Allergy status to other antibiotic agents status: Secondary | ICD-10-CM

## 2023-11-30 DIAGNOSIS — O409XX0 Polyhydramnios, unspecified trimester, not applicable or unspecified: Secondary | ICD-10-CM | POA: Diagnosis present

## 2023-11-30 DIAGNOSIS — K219 Gastro-esophageal reflux disease without esophagitis: Secondary | ICD-10-CM | POA: Diagnosis present

## 2023-11-30 DIAGNOSIS — O34211 Maternal care for low transverse scar from previous cesarean delivery: Secondary | ICD-10-CM | POA: Diagnosis present

## 2023-11-30 DIAGNOSIS — Z87891 Personal history of nicotine dependence: Secondary | ICD-10-CM | POA: Diagnosis not present

## 2023-11-30 DIAGNOSIS — O99354 Diseases of the nervous system complicating childbirth: Secondary | ICD-10-CM | POA: Diagnosis present

## 2023-11-30 DIAGNOSIS — O09899 Supervision of other high risk pregnancies, unspecified trimester: Secondary | ICD-10-CM

## 2023-11-30 DIAGNOSIS — O99210 Obesity complicating pregnancy, unspecified trimester: Secondary | ICD-10-CM

## 2023-11-30 DIAGNOSIS — G40909 Epilepsy, unspecified, not intractable, without status epilepticus: Secondary | ICD-10-CM | POA: Diagnosis present

## 2023-11-30 DIAGNOSIS — O139 Gestational [pregnancy-induced] hypertension without significant proteinuria, unspecified trimester: Secondary | ICD-10-CM | POA: Diagnosis present

## 2023-11-30 DIAGNOSIS — Z833 Family history of diabetes mellitus: Secondary | ICD-10-CM

## 2023-11-30 DIAGNOSIS — O99350 Diseases of the nervous system complicating pregnancy, unspecified trimester: Secondary | ICD-10-CM

## 2023-11-30 DIAGNOSIS — O403XX0 Polyhydramnios, third trimester, not applicable or unspecified: Secondary | ICD-10-CM | POA: Diagnosis present

## 2023-11-30 DIAGNOSIS — Z98891 History of uterine scar from previous surgery: Secondary | ICD-10-CM

## 2023-11-30 DIAGNOSIS — Z3A38 38 weeks gestation of pregnancy: Secondary | ICD-10-CM

## 2023-11-30 DIAGNOSIS — Z23 Encounter for immunization: Secondary | ICD-10-CM

## 2023-11-30 DIAGNOSIS — Z2839 Other underimmunization status: Secondary | ICD-10-CM

## 2023-11-30 DIAGNOSIS — O9962 Diseases of the digestive system complicating childbirth: Secondary | ICD-10-CM | POA: Diagnosis present

## 2023-11-30 DIAGNOSIS — O0990 Supervision of high risk pregnancy, unspecified, unspecified trimester: Secondary | ICD-10-CM

## 2023-11-30 DIAGNOSIS — O134 Gestational [pregnancy-induced] hypertension without significant proteinuria, complicating childbirth: Principal | ICD-10-CM | POA: Diagnosis present

## 2023-11-30 DIAGNOSIS — O99214 Obesity complicating childbirth: Secondary | ICD-10-CM | POA: Diagnosis present

## 2023-11-30 DIAGNOSIS — O133 Gestational [pregnancy-induced] hypertension without significant proteinuria, third trimester: Principal | ICD-10-CM

## 2023-11-30 DIAGNOSIS — Z3490 Encounter for supervision of normal pregnancy, unspecified, unspecified trimester: Principal | ICD-10-CM

## 2023-11-30 LAB — CBC
HCT: 29.7 % — ABNORMAL LOW (ref 36.0–46.0)
HCT: 28.7 % — ABNORMAL LOW (ref 36.0–46.0)
Hemoglobin: 9.7 g/dL — ABNORMAL LOW (ref 12.0–15.0)
Hemoglobin: 9.3 g/dL — ABNORMAL LOW (ref 12.0–15.0)
MCH: 32.3 pg (ref 26.0–34.0)
MCH: 32.1 pg (ref 26.0–34.0)
MCHC: 32.7 g/dL (ref 30.0–36.0)
MCHC: 32.4 g/dL (ref 30.0–36.0)
MCV: 99 fL (ref 80.0–100.0)
MCV: 99 fL (ref 80.0–100.0)
Platelets: 284 10*3/uL (ref 150–400)
Platelets: 266 10*3/uL (ref 150–400)
RBC: 3 MIL/uL — ABNORMAL LOW (ref 3.87–5.11)
RBC: 2.9 MIL/uL — ABNORMAL LOW (ref 3.87–5.11)
RDW: 16.3 % — ABNORMAL HIGH (ref 11.5–15.5)
RDW: 16 % — ABNORMAL HIGH (ref 11.5–15.5)
WBC: 7.3 10*3/uL (ref 4.0–10.5)
WBC: 8.3 10*3/uL (ref 4.0–10.5)
nRBC: 0 % (ref 0.0–0.2)
nRBC: 0 % (ref 0.0–0.2)

## 2023-11-30 LAB — COMPREHENSIVE METABOLIC PANEL
ALT: 6 U/L (ref 0–44)
AST: 9 U/L — ABNORMAL LOW (ref 15–41)
Albumin: 2.5 g/dL — ABNORMAL LOW (ref 3.5–5.0)
Alkaline Phosphatase: 92 U/L (ref 38–126)
Anion gap: 7 (ref 5–15)
BUN: 7 mg/dL (ref 6–20)
CO2: 19 mmol/L — ABNORMAL LOW (ref 22–32)
Calcium: 8.3 mg/dL — ABNORMAL LOW (ref 8.9–10.3)
Chloride: 110 mmol/L (ref 98–111)
Creatinine, Ser: 0.99 mg/dL (ref 0.44–1.00)
GFR, Estimated: >60 mL/min (ref >60)
Glucose, Bld: 80 mg/dL (ref 70–99)
Potassium: 4 mmol/L (ref 3.5–5.1)
Sodium: 136 mmol/L (ref 135–145)
Total Bilirubin: 0.3 mg/dL (ref 0.0–1.2)
Total Protein: 5.6 g/dL — ABNORMAL LOW (ref 6.5–8.1)

## 2023-11-30 LAB — TYPE AND SCREEN
ABO/RH(D): A POS
Antibody Screen: NEGATIVE

## 2023-11-30 LAB — PROTEIN / CREATININE RATIO, URINE
Creatinine, Urine: 192 mg/dL
Protein Creatinine Ratio: 0.19 mg/mg{creat} — ABNORMAL HIGH (ref 0.00–0.15)
Total Protein, Urine: 37 mg/dL

## 2023-11-30 MED ORDER — TERBUTALINE SULFATE 1 MG/ML IJ SOLN
INTRAMUSCULAR | Status: AC
Start: 2023-11-30 — End: 2023-11-30
  Administered 2023-11-30: 0.25 mg via SUBCUTANEOUS
  Filled 2023-11-30: qty 1

## 2023-11-30 MED ORDER — ONDANSETRON HCL 4 MG/2ML IJ SOLN: 4.0000 mg | Freq: Four times a day (QID) | INTRAMUSCULAR | Status: DC | PRN

## 2023-11-30 MED ORDER — OXYCODONE-ACETAMINOPHEN 5-325 MG PO TABS: 1.0000 | ORAL_TABLET | ORAL | Status: DC | PRN

## 2023-11-30 MED ORDER — TOPIRAMATE 100 MG PO TABS
200.0000 mg | ORAL_TABLET | Freq: Two times a day (BID) | ORAL | Status: DC
Start: 2023-11-30 — End: 2023-12-02
  Administered 2023-11-30 – 2023-12-02 (×4): 200 mg via ORAL
  Filled 2023-11-30 (×4): qty 2

## 2023-11-30 MED ORDER — NIFEDIPINE 10 MG PO CAPS: 20.0000 mg | ORAL_CAPSULE | ORAL | Status: DC | PRN

## 2023-11-30 MED ORDER — EPHEDRINE 5 MG/ML INJ: 10.0000 mg | INTRAVENOUS | Status: DC | PRN

## 2023-11-30 MED ORDER — LACTATED RINGERS IV SOLN
INTRAVENOUS | Status: AC
Start: 2023-11-30 — End: 2023-12-01

## 2023-11-30 MED ORDER — OXYCODONE-ACETAMINOPHEN 5-325 MG PO TABS: 2.0000 | ORAL_TABLET | ORAL | Status: DC | PRN

## 2023-11-30 MED ORDER — LACTATED RINGERS IV SOLN: INTRAVENOUS | Status: DC

## 2023-11-30 MED ORDER — ACETAMINOPHEN 325 MG PO TABS: 650.0000 mg | ORAL_TABLET | ORAL | Status: DC | PRN

## 2023-11-30 MED ORDER — PHENYLEPHRINE 80 MCG/ML (10ML) SYRINGE FOR IV PUSH (FOR BLOOD PRESSURE SUPPORT): 80.0000 ug | PREFILLED_SYRINGE | INTRAVENOUS | Status: DC | PRN

## 2023-11-30 MED ORDER — LIDOCAINE HCL (PF) 1 % IJ SOLN: 30.0000 mL | INTRAMUSCULAR | Status: DC | PRN

## 2023-11-30 MED ORDER — FENTANYL-BUPIVACAINE-NACL 0.5-0.125-0.9 MG/250ML-% EP SOLN: 12.0000 mL/h | EPIDURAL | Status: DC | PRN

## 2023-11-30 MED ORDER — OXYTOCIN-SODIUM CHLORIDE 30-0.9 UT/500ML-% IV SOLN
1.0000 m[IU]/min | INTRAVENOUS | Status: DC
  Administered 2023-11-30: 2 m[IU]/min via INTRAVENOUS
  Filled 2023-11-30: qty 500

## 2023-11-30 MED ORDER — DIPHENHYDRAMINE HCL 50 MG/ML IJ SOLN: 12.5000 mg | INTRAMUSCULAR | Status: DC | PRN

## 2023-11-30 MED ORDER — NIFEDIPINE 10 MG PO CAPS: 10.0000 mg | ORAL_CAPSULE | ORAL | Status: DC | PRN

## 2023-11-30 MED ORDER — FENTANYL-BUPIVACAINE-NACL 0.5-0.125-0.9 MG/250ML-% EP SOLN
12.0000 mL/h | EPIDURAL | Status: DC | PRN
  Administered 2023-11-30: 12 mL/h via EPIDURAL
  Filled 2023-11-30: qty 250

## 2023-11-30 MED ORDER — LACTATED RINGERS IV SOLN
500.0000 mL | INTRAVENOUS | Status: DC | PRN
  Administered 2023-11-30: 1000 mL via INTRAVENOUS

## 2023-11-30 MED ORDER — OXYTOCIN BOLUS FROM INFUSION: 333.0000 mL | Freq: Once | INTRAVENOUS | Status: DC

## 2023-11-30 MED ORDER — PHENYLEPHRINE 80 MCG/ML (10ML) SYRINGE FOR IV PUSH (FOR BLOOD PRESSURE SUPPORT)
80.0000 ug | PREFILLED_SYRINGE | INTRAVENOUS | Status: DC | PRN
  Filled 2023-11-30: qty 10

## 2023-11-30 MED ORDER — OXYTOCIN-SODIUM CHLORIDE 30-0.9 UT/500ML-% IV SOLN
2.5000 [IU]/h | INTRAVENOUS | Status: DC
  Administered 2023-12-01: 30 [IU] via INTRAVENOUS

## 2023-11-30 MED ORDER — PHENYLEPHRINE 80 MCG/ML (10ML) SYRINGE FOR IV PUSH (FOR BLOOD PRESSURE SUPPORT)
80.0000 ug | PREFILLED_SYRINGE | INTRAVENOUS | Status: DC | PRN
  Administered 2023-12-01: 80 ug via INTRAVENOUS

## 2023-11-30 MED ORDER — LACTATED RINGERS IV SOLN: 500.0000 mL | Freq: Once | INTRAVENOUS | Status: DC

## 2023-11-30 MED ORDER — LACTATED RINGERS IV SOLN
500.0000 mL | Freq: Once | INTRAVENOUS | Status: DC
Start: 2023-12-01 — End: 2023-12-01

## 2023-11-30 MED ORDER — LABETALOL HCL 5 MG/ML IV SOLN: 40.0000 mg | INTRAVENOUS | Status: DC | PRN

## 2023-11-30 MED ORDER — MISOPROSTOL 50MCG HALF TABLET: 50.0000 ug | ORAL_TABLET | Freq: Once | ORAL | Status: DC

## 2023-11-30 MED ORDER — MISOPROSTOL 25 MCG QUARTER TABLET: 25.0000 ug | ORAL_TABLET | Freq: Once | ORAL | Status: DC

## 2023-11-30 MED ORDER — SOD CITRATE-CITRIC ACID 500-334 MG/5ML PO SOLN
30.0000 mL | ORAL | Status: DC | PRN
  Administered 2023-12-01: 30 mL via ORAL
  Filled 2023-11-30: qty 30

## 2023-11-30 MED ORDER — LACTATED RINGERS AMNIOINFUSION: INTRAVENOUS | Status: DC

## 2023-11-30 MED ORDER — TERBUTALINE SULFATE 1 MG/ML IJ SOLN
0.2500 mg | Freq: Once | INTRAMUSCULAR | Status: AC | PRN
  Filled 2023-11-30: qty 1

## 2023-11-30 NOTE — Progress Notes (Signed)
Patient ID: Jamie Archer, female   DOB: Mar 17, 1991, 33 y.o.   MRN: [id number]  Feeling ctx as stronger, but not necessarily closer  BPs 140/82, 138/79 FHR 120-130s, variables of unknown type, +variability (IFSE already placed) Ctx unknown Cx 4/90/vtx -3 (pt on R side- difficult to fully examine); IUPC placed without resistance  IUP@38 .6wks gHTN Mild poly IOL process  Will be able to fully assess EFM now and will plan accordingly  [name] CNM 11/30/2023

## 2023-11-30 NOTE — H&P (Signed)
Jamie Archer is a 33 y.o. female presenting for IOL for gHTN at [redacted]w[redacted]d. Sent from OB's office for SRBP's. Denies any HA's, visual changes, RUQ pain, and offers no OB C/o at this time. Good FM's, no VB, LOF and scheduled for IOL in AM d/t polyhydramnios. After chart review there was an elevated BP at 146/69 on 08/25/23 therefore meeting criteria for gHTN with today's BP of 161/99. DW DR Constant ( OB Attending) Will admit and start IOL today. PreE Labs sent from MAU  OB History     Gravida  1   Para      Term      Preterm      AB      Living         SAB      IAB      Ectopic      Multiple      Live Births             Past Medical History:  Diagnosis Date   Acid reflux    Migraine    Pneumonia    Seizures (HCC)    Past Surgical History:  Procedure Laterality Date   KNEE SURGERY Right 06/13/2023   tibula Right 06/15/2023   WISDOM TOOTH EXTRACTION     bilateral bottom   Family History: family history includes Cancer in her maternal grandmother; Diabetes in her father and maternal grandmother; Hearing loss in her maternal grandfather; Migraines in her father, maternal grandmother, and mother. Social History:  reports that she has quit smoking. Her smoking use included cigarettes. She has never used smokeless tobacco. She reports that she does not currently use alcohol. She reports that she does not currently use drugs after having used the following drugs: Marijuana.      NURSING  PROVIDER  Office Location [place] Dating by LMP c/w U/S at 15 wks  [facility] Model Traditional Anatomy U/S Normal f/u for obesity  Initiated care at  19 weeks                  Language  English               LAB RESULTS   Support Person Malik(FOB)  Genetics NIPS:  AFP: Negative      NT/IT (FT only)        Carrier Screen [place]:   Rhogam  A/Positive/-- (10/14 1610) A1C/GTT Early:  Third trimester:  Glucose, Fasting 83  Glucose, 1 hour 99  Glucose, 2 hour 115    Flu Vaccine         TDaP Vaccine 10/19/23 Blood Type A/Positive/-- (10/14 0917)  Covid Vaccine   Antibody Negative (10/14 0917)  RSV Vaccine   Rubella <0.90 (10/14 9604)  Feeding Plan breast RPR Non Reactive (10/14 0917)  Contraception Yes of some sort HBsAg Negative (10/14 0917)  Circumcision N/A HIV Non Reactive (10/14 0917)  Pediatrician  Unsure  HCVAb Non Reactive (10/14 0917)  Prenatal Classes            Pap No results found for: "DIAGPAP"  BTLConsent   GC/CT Initial:   36wks:    VBAC  Consent   GBS For PCN allergy, check sensitivities            DME Rx [ ]  BP cuff [ ]  Weight Scale Waterbirth  [ ]  Class [ ]  Consent [ ]  CNM visit  PHQ9 & GAD7 [ X ] new OB [  ] 28 weeks  [  ]  36 weeks Induction  [ ]  Orders Entered [ ] Foley Y/N    Review of Systems  Constitutional:  Negative for chills, fatigue and fever.  Eyes:  Negative for pain and visual disturbance.  Respiratory:  Negative for apnea, shortness of breath and wheezing.   Cardiovascular:  Negative for chest pain and palpitations.  Gastrointestinal:  Negative for abdominal pain, constipation, diarrhea, nausea and vomiting.  Genitourinary:  Negative for difficulty urinating, dysuria, pelvic pain, vaginal bleeding, vaginal discharge and vaginal pain.  Musculoskeletal:  Negative for back pain.  Neurological:  Negative for seizures, weakness and headaches.  Psychiatric/Behavioral:  Negative for suicidal ideas.    Maternal Medical History:  Reason for admission: Nausea.  Fetal activity: Perceived fetal activity is normal.   Prenatal complications: PIH.   Prenatal Complications - Diabetes: none.     Blood pressure (!) 153/82, pulse (!) 54, temperature 98.4 F (36.9 C), resp. rate 18, height 5\' 6"  (1.676 m), weight (!) 145 kg, last menstrual period 02/25/2023, SpO2 100%. Maternal Exam:  Abdomen: Patient reports no abdominal tenderness. Fetal presentation: vertex Cervix: Cervix evaluated by digital exam.     Fetal Exam Fetal Monitor Review:  Baseline rate: 140.  Variability: moderate (6-25 bpm).   Pattern: accelerations present and no decelerations.   Fetal State Assessment: Category I - tracings are normal.   Physical Exam Vitals and nursing note reviewed. Exam conducted with a chaperone present.  Constitutional:      General: She is not in acute distress.    Appearance: Normal appearance.  HENT:     Head: Normocephalic.  Cardiovascular:     Rate and Rhythm: Normal rate.  Pulmonary:     Effort: Pulmonary effort is normal.  Abdominal:     Palpations: Abdomen is soft.     Tenderness: There is no abdominal tenderness.  Musculoskeletal:     Cervical back: Normal range of motion.  Skin:    General: Skin is warm and dry.  Neurological:     Mental Status: She is alert and oriented to person, place, and time.  Psychiatric:        Mood and Affect: Mood normal.      Patient Vitals for the past 24 hrs:  BP Temp Pulse Resp SpO2 Height Weight  11/30/23 1210 -- -- -- -- 100 % -- --  11/30/23 1205 -- -- -- -- 100 % -- --  11/30/23 1201 (!) 153/82 -- (!) 54 -- -- -- --  11/30/23 1200 -- -- -- -- 100 % -- --  11/30/23 1150 -- -- -- -- 100 % -- --  11/30/23 1146 138/79 -- (!) 58 -- -- -- --  11/30/23 1145 -- -- -- -- 100 % -- --  11/30/23 1140 -- -- -- -- 100 % -- --  11/30/23 1135 -- -- -- -- 100 % -- --  11/30/23 1130 -- -- -- -- 100 % -- --  11/30/23 1125 -- -- -- -- 100 % -- --  11/30/23 1120 -- -- -- -- 100 % -- --  11/30/23 1117 137/79 -- (!) 54 -- -- -- --  11/30/23 1115 -- -- -- -- 100 % -- --  11/30/23 1110 (!) 157/84 -- (!) 54 -- 100 % -- --  11/30/23 1108 (!) 157/84 98.4 F (36.9 C) (!) 51 18 100 % 5\' 6"  (1.676 m) (!) 145 kg    Prenatal labs: ABO, Rh: --/--/PENDING (02/19 1300) Antibody: PENDING (02/19 1300) Rubella: <0.90 (10/14 0917) RPR: Non Reactive (12/10 0859)  HBsAg: Negative (10/14 0917)  HIV: Non Reactive (12/10 0859)  GBS: Negative/-- (02/04 0846)   Assessment/Plan: Jamie Archer , a  33  y.o. G1P0 at [redacted]w[redacted]d presents to L&D for IOL for gHTN at this time.    Labor: Dual Cyotec with Balloon if possible FWB: Cat I PreE: Elevated BPs, but none severe range at this time. PreE labs normal at this time.  H&H: Hgb 9.7. TXA on standby if needed.  GBS negative  Pain: Epidural  MOF: Bottle  MOC: OCP or nexplanon MOD- NSVD at this time  Circ: expecting baby Girl   @ 1:33 PM- D/t L&D acuity. Patient downstairs in MAU awaiting a bed. CNM down to patient bedside for introductions and discuss plan of care.   CNM to patient bedside to discuss FB placement. Reviewed risks and benefits with patient and patients Mother.  Patient desires to proceed with FB placement. FHT Cat 1 prior to placement. SVE 1/80/-2 S. Suzie Portela CNM.  FB placed without difficulty on first attempt and filled to 60 cc of fluid. Patient and fetus tolerated well and FHT remained Cat 1 following placement. Once expelled, plan to AROM and or start Pit. Claudette Head, MSN CNM  11/30/2023, 1:27 PM

## 2023-11-30 NOTE — Progress Notes (Signed)
Patient ID: Jamie Archer, female   DOB: 09/11/1991, 33 y.o.   MRN: [id number]  S/p cervical foley; Pit started at 1730; no s/s pre-e> feels well  BPs 138/79, 130/71 FHR 140s, +accels, occ mi variables Ctx irreg with Pit @ 55mu/min Cx 3/90/vtx -2; BBOW for AROM>clear mod amt fluid  Pre-labs neg (creat 0.99)  IUP@38 .6wks gHTN Mild poly Cx favorable Seizure d/o  Discussion re AROM as part of IOL process- pt is agreeable and would like it now Continue home Topamax 200mg  bid dosing No severe range BPs or symptoms of pre-e Hopeful that with AROM/Pit that she will get into active labor and progress towards delivery  [name] [facility] 11/30/2023 7:43 PM

## 2023-11-30 NOTE — MAU Provider Note (Signed)
Chief Complaint:  Hypertension   HPI     Jamie Archer is a 33 y.o. G1P0 at [redacted]w[redacted]d who presents to maternity admissions reporting that she was sent from OB's office for SRBP's. Denies any HA's, visual changes, RUQ pain, and offers no OB C/o at this time. Good FM's, no VB, LOF and scheduled for IOL in AM d/t polyhydramnios. After chart review there was an elevated BP at 146/69 on 08/25/23 therefore meeting criteria for gHTN with today's BP of 161/99. DW DR Constant ( OB Attending) Will admit and start IOL today. PreE Labs sent from MAU   Pregnancy Course: [facility] - [facility]  Past Medical History:  Diagnosis Date   Acid reflux    Migraine    Pneumonia    Seizures (HCC)    OB History  Gravida Para Term Preterm AB Living  1       SAB IAB Ectopic Multiple Live Births          # Outcome Date GA Lbr Len/2nd Weight Sex Type Anes PTL Lv  1 Current            Past Surgical History:  Procedure Laterality Date   KNEE SURGERY Right 06/13/2023   tibula Right 06/15/2023   WISDOM TOOTH EXTRACTION     bilateral bottom   Family History  Problem Relation Age of Onset   Migraines Mother    Diabetes Father    Migraines Father    Diabetes Maternal Grandmother    Cancer Maternal Grandmother    Migraines Maternal Grandmother    Hearing loss Maternal Grandfather    Hyperlipidemia Neg Hx    Asthma Neg Hx    Social History   Tobacco Use   Smoking status: Former    Current packs/day: 0.50    Types: Cigarettes   Smokeless tobacco: Never  Vaping Use   Vaping status: Never Used  Substance Use Topics   Alcohol use: Not Currently   Drug use: Not Currently    Types: Marijuana    Comment: last 11/24   Allergies  Allergen Reactions   Clindamycin/Lincomycin     rash   Lacosamide Nausea And Vomiting   Azithromycin Nausea And Vomiting and Rash   Codeine Nausea And Vomiting and Rash   Penicillins Nausea And Vomiting and Rash   Medications Prior to Admission  Medication Sig Dispense Refill Last  Dose/Taking   ferrous sulfate 325 (65 FE) MG EC tablet Take 1 tablet (325 mg total) by mouth every other day. 45 tablet 1 11/30/2023   pantoprazole (PROTONIX) 20 MG tablet Take 1 tablet (20 mg total) by mouth daily. 30 tablet 4 11/30/2023   Prenatal Vit-Fe Fumarate-FA (PRENATAL MULTIVITAMIN) TABS tablet Take 1 tablet by mouth daily at 12 noon.   11/30/2023   topiramate (TOPAMAX) 200 MG tablet Take 1 tablet twice a day   11/30/2023   oxyCODONE-acetaminophen (PERCOCET/ROXICET) 5-325 MG tablet Take by mouth. (Patient not taking: Reported on 08/17/2023)       I have reviewed patient's Past Medical Hx, Surgical Hx, Family Hx, Social Hx, medications and allergies.   ROS  Pertinent items noted in HPI and remainder of comprehensive ROS otherwise negative.   PHYSICAL EXAM  Patient Vitals for the past 24 hrs:  BP Temp Pulse Resp SpO2 Height Weight  11/30/23 1117 137/79 -- (!) 54 -- -- -- --  11/30/23 1108 (!) 157/84 98.4 F (36.9 C) (!) 51 18 100 % 5\' 6"  (1.676 m) (!) 145 kg    Constitutional:  Well-developed, obese female in no acute distress.  Cardiovascular: normal rate & rhythm, warm and well-perfused Respiratory: normal effort, no problems with respiration noted GI: Abd soft, non-tender, gravid MS: Extremities nontender, no edema, normal ROM Neurologic: Alert and oriented x 4.  GU: no CVA tenderness Pelvic: Deferred - To be evaluated on LD for IOL      Fetal Tracing: @ 1133 Cat 1 Reactive Baseline: 140 Variability: moderate  Accelerations: present Decelerations: absent Toco: no ctx's   Labs: No results found for this or any previous visit (from the past 24 hours).    MDM & MAU COURSE  MDM:  ADMIT TO LD as per conversation with Dr Jolayne Panther gHTN at 38.6 GA   MAU Course: Orders Placed This Encounter  Procedures   CBC   Comprehensive metabolic panel   Protein / creatinine ratio, urine   Notify physician (specify) Confirmatory reading of BP> 160/110 15 minutes later   Apply  Hypertensive Disorders of Pregnancy Care Plan   Measure blood pressure   Meds ordered this encounter  Medications   AND Linked Order Group    NIFEdipine (PROCARDIA) capsule 10 mg    NIFEdipine (PROCARDIA) capsule 20 mg    NIFEdipine (PROCARDIA) capsule 20 mg    labetalol (NORMODYNE) injection 40 mg    ASSESSMENT  No diagnosis found.  PLAN  ADMIT TO LD as per conversation with Dr Jolayne Panther gHTN at 38.6 GA - IOL  PreE labs pending   Marcell Barlow, MSN, [facility], [facility] for [employer]

## 2023-11-30 NOTE — Progress Notes (Signed)

## 2023-11-30 NOTE — MAU Note (Signed)
.  Tracia Lacomb is a 33 y.o. at [redacted]w[redacted]d here in MAU reporting: she was sent over from the office because of elevated b/p's. Denies headache or blurred vision. Reports positive fetal movement. Denies bleeding but reports discharge that is more than normal and is clear. Denies contractions.   Onset of complaint: today Pain score: 0/10 There were no vitals filed for this visit.   FHT: 138  Lab orders placed from triage:

## 2023-11-30 NOTE — Anesthesia Preprocedure Evaluation (Addendum)
Anesthesia Evaluation  Patient identified by MRN, date of birth, ID band Patient awake    Reviewed: Allergy & Precautions, Patient's Chart, lab work & pertinent test results  Airway Mallampati: III       Dental no notable dental hx.    Pulmonary former smoker   Pulmonary exam normal        Cardiovascular hypertension, Normal cardiovascular exam     Neuro/Psych  Headaches, Seizures -,     GI/Hepatic Neg liver ROS,GERD  ,,  Endo/Other    Class 4 obesity  Renal/GU negative Renal ROS     Musculoskeletal   Abdominal  (+) + obese  Peds  Hematology   Anesthesia Other Findings   Reproductive/Obstetrics (+) Pregnancy                             Anesthesia Physical Anesthesia Plan  ASA: 3  Anesthesia Plan: Epidural   Post-op Pain Management:    Induction:   PONV Risk Score and Plan: 0  Airway Management Planned: Natural Airway  Additional Equipment: None  Intra-op Plan:   Post-operative Plan:   Informed Consent: I have reviewed the patients History and Physical, chart, labs and discussed the procedure including the risks, benefits and alternatives for the proposed anesthesia with the patient or authorized representative who has indicated his/her understanding and acceptance.       Plan Discussed with:   Anesthesia Plan Comments: (Lab Results      Component                Value               Date                      WBC                      8.3                 11/30/2023                HGB                      9.3 (L)             11/30/2023                HCT                      28.7 (L)            11/30/2023                MCV                      99.0                11/30/2023                PLT                      266                 11/30/2023           )       Anesthesia Quick Evaluation
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
## 2023-12-01 ENCOUNTER — Inpatient Hospital Stay (HOSPITAL_COMMUNITY): Admission: RE | Admit: 2023-12-01 | Payer: BC Managed Care – PPO | Source: Home / Self Care | Admitting: Family Medicine

## 2023-12-01 ENCOUNTER — Encounter (HOSPITAL_COMMUNITY)
Admission: AD | Disposition: A | Discharge status: Home / Self Care | Payer: Self-pay | Source: Home / Self Care | Attending: Obstetrics and Gynecology

## 2023-12-01 ENCOUNTER — Inpatient Hospital Stay (HOSPITAL_COMMUNITY): Payer: BC Managed Care – PPO

## 2023-12-01 ENCOUNTER — Other Ambulatory Visit: Payer: Self-pay

## 2023-12-01 ENCOUNTER — Encounter (HOSPITAL_COMMUNITY): Payer: Self-pay | Admitting: Family Medicine

## 2023-12-01 DIAGNOSIS — Z3A38 38 weeks gestation of pregnancy: Secondary | ICD-10-CM

## 2023-12-01 DIAGNOSIS — O134 Gestational [pregnancy-induced] hypertension without significant proteinuria, complicating childbirth: Secondary | ICD-10-CM | POA: Diagnosis not present

## 2023-12-01 DIAGNOSIS — O403XX0 Polyhydramnios, third trimester, not applicable or unspecified: Secondary | ICD-10-CM | POA: Diagnosis not present

## 2023-12-01 HISTORY — PX: APPLICATION OF WOUND VAC: SHX5189

## 2023-12-01 LAB — CBC
HCT: 25.6 % — ABNORMAL LOW (ref 36.0–46.0)
Hemoglobin: 8.4 g/dL — ABNORMAL LOW (ref 12.0–15.0)
MCH: 32.7 pg (ref 26.0–34.0)
MCHC: 32.8 g/dL (ref 30.0–36.0)
MCV: 99.6 fL (ref 80.0–100.0)
Platelets: 219 10*3/uL (ref 150–400)
RBC: 2.57 MIL/uL — ABNORMAL LOW (ref 3.87–5.11)
RDW: 16.1 % — ABNORMAL HIGH (ref 11.5–15.5)
WBC: 8.7 10*3/uL (ref 4.0–10.5)
nRBC: 0 % (ref 0.0–0.2)

## 2023-12-01 LAB — RPR: RPR Ser Ql: NONREACTIVE

## 2023-12-01 SURGERY — Surgical Case
Anesthesia: Epidural | Site: Abdomen

## 2023-12-01 MED ORDER — SIMETHICONE 80 MG PO CHEW
80.0000 mg | CHEWABLE_TABLET | Freq: Three times a day (TID) | ORAL | Status: DC
  Administered 2023-12-01 – 2023-12-02 (×4): 80 mg via ORAL
  Filled 2023-12-01 (×6): qty 1

## 2023-12-01 MED ORDER — COCONUT OIL OIL: 1.0000 | TOPICAL_OIL | Status: DC | PRN

## 2023-12-01 MED ORDER — ACETAMINOPHEN 160 MG/5ML PO SOLN: 325.0000 mg | ORAL | Status: DC | PRN

## 2023-12-01 MED ORDER — SCOPOLAMINE 1 MG/3DAYS TD PT72
1.0000 | MEDICATED_PATCH | Freq: Once | TRANSDERMAL | Status: DC
  Administered 2023-12-01: 1.5 mg via TRANSDERMAL

## 2023-12-01 MED ORDER — ACETAMINOPHEN 325 MG PO TABS: 650.0000 mg | ORAL_TABLET | ORAL | Status: DC | PRN

## 2023-12-01 MED ORDER — WITCH HAZEL-GLYCERIN EX PADS: 1.0000 | MEDICATED_PAD | CUTANEOUS | Status: DC | PRN

## 2023-12-01 MED ORDER — DEXTROSE 5 % IV SOLN
INTRAVENOUS | Status: DC | PRN
  Administered 2023-12-01: 3 g via INTRAVENOUS

## 2023-12-01 MED ORDER — MEPERIDINE HCL 25 MG/ML IJ SOLN: 6.2500 mg | INTRAMUSCULAR | Status: DC | PRN

## 2023-12-01 MED ORDER — FENTANYL CITRATE (PF) 100 MCG/2ML IJ SOLN
INTRAMUSCULAR | Status: AC
  Filled 2023-12-01: qty 2

## 2023-12-01 MED ORDER — MORPHINE SULFATE (PF) 0.5 MG/ML IJ SOLN
INTRAMUSCULAR | Status: AC
  Filled 2023-12-01: qty 10

## 2023-12-01 MED ORDER — TRANEXAMIC ACID-NACL 1000-0.7 MG/100ML-% IV SOLN
INTRAVENOUS | Status: AC
  Filled 2023-12-01: qty 100

## 2023-12-01 MED ORDER — SENNOSIDES-DOCUSATE SODIUM 8.6-50 MG PO TABS
2.0000 | ORAL_TABLET | Freq: Every day | ORAL | Status: DC
  Administered 2023-12-02: 2 via ORAL
  Filled 2023-12-01 (×2): qty 2

## 2023-12-01 MED ORDER — OXYTOCIN-SODIUM CHLORIDE 30-0.9 UT/500ML-% IV SOLN
1.0000 m[IU]/min | INTRAVENOUS | Status: DC
Start: 2023-12-01 — End: 2023-12-01

## 2023-12-01 MED ORDER — OXYCODONE HCL 5 MG PO TABS: 5.0000 mg | ORAL_TABLET | ORAL | Status: DC | PRN

## 2023-12-01 MED ORDER — KETOROLAC TROMETHAMINE 30 MG/ML IJ SOLN: 30.0000 mg | Freq: Four times a day (QID) | INTRAMUSCULAR | Status: AC | PRN

## 2023-12-01 MED ORDER — OXYTOCIN-SODIUM CHLORIDE 30-0.9 UT/500ML-% IV SOLN
2.5000 [IU]/h | INTRAVENOUS | Status: AC
  Administered 2023-12-01: 2.5 [IU]/h via INTRAVENOUS
  Filled 2023-12-01: qty 500

## 2023-12-01 MED ORDER — CEFAZOLIN IN SODIUM CHLORIDE 3-0.9 GM/100ML-% IV SOLN
INTRAVENOUS | Status: AC
  Filled 2023-12-01: qty 100

## 2023-12-01 MED ORDER — ZOLPIDEM TARTRATE 5 MG PO TABS: 5.0000 mg | ORAL_TABLET | Freq: Every evening | ORAL | Status: DC | PRN

## 2023-12-01 MED ORDER — CEFAZOLIN IN SODIUM CHLORIDE 3-0.9 GM/100ML-% IV SOLN: 3.0000 g | INTRAVENOUS | Status: DC

## 2023-12-01 MED ORDER — SCOPOLAMINE 1 MG/3DAYS TD PT72
MEDICATED_PATCH | TRANSDERMAL | Status: AC
  Filled 2023-12-01: qty 1

## 2023-12-01 MED ORDER — IBUPROFEN 600 MG PO TABS
600.0000 mg | ORAL_TABLET | Freq: Four times a day (QID) | ORAL | Status: DC
  Administered 2023-12-01 – 2023-12-03 (×9): 600 mg via ORAL
  Filled 2023-12-01 (×9): qty 1

## 2023-12-01 MED ORDER — NALOXONE HCL 4 MG/10ML IJ SOLN: 1.0000 ug/kg/h | INTRAVENOUS | Status: DC | PRN

## 2023-12-01 MED ORDER — SODIUM CHLORIDE 0.9% FLUSH: 3.0000 mL | INTRAVENOUS | Status: DC | PRN

## 2023-12-01 MED ORDER — PRENATAL MULTIVITAMIN CH
1.0000 | ORAL_TABLET | Freq: Every day | ORAL | Status: DC
  Administered 2023-12-02 – 2023-12-03 (×2): 1 via ORAL
  Filled 2023-12-01 (×2): qty 1

## 2023-12-01 MED ORDER — KETOROLAC TROMETHAMINE 30 MG/ML IJ SOLN
INTRAMUSCULAR | Status: AC
Start: 2023-12-01 — End: ?
  Filled 2023-12-01: qty 1

## 2023-12-01 MED ORDER — KETOROLAC TROMETHAMINE 30 MG/ML IJ SOLN
30.0000 mg | Freq: Four times a day (QID) | INTRAMUSCULAR | Status: AC | PRN
  Administered 2023-12-01: 30 mg via INTRAVENOUS

## 2023-12-01 MED ORDER — DIPHENHYDRAMINE HCL 25 MG PO CAPS: 25.0000 mg | ORAL_CAPSULE | ORAL | Status: DC | PRN

## 2023-12-01 MED ORDER — DEXAMETHASONE SODIUM PHOSPHATE 10 MG/ML IJ SOLN
INTRAMUSCULAR | Status: AC
  Filled 2023-12-01: qty 1

## 2023-12-01 MED ORDER — TRANEXAMIC ACID-NACL 1000-0.7 MG/100ML-% IV SOLN
INTRAVENOUS | Status: DC | PRN
Start: 2023-12-01 — End: 2023-12-01
  Administered 2023-12-01: 1000 mg via INTRAVENOUS

## 2023-12-01 MED ORDER — ENOXAPARIN SODIUM 80 MG/0.8ML IJ SOSY
70.0000 mg | PREFILLED_SYRINGE | INTRAMUSCULAR | Status: DC
  Administered 2023-12-01 – 2023-12-02 (×2): 70 mg via SUBCUTANEOUS
  Filled 2023-12-01 (×2): qty 0.8

## 2023-12-01 MED ORDER — ACETAMINOPHEN 10 MG/ML IV SOLN
INTRAVENOUS | Status: AC
  Filled 2023-12-01: qty 100

## 2023-12-01 MED ORDER — DIPHENHYDRAMINE HCL 50 MG/ML IJ SOLN: 12.5000 mg | INTRAMUSCULAR | Status: DC | PRN

## 2023-12-01 MED ORDER — SODIUM CHLORIDE 0.9 % IR SOLN
Status: DC | PRN
  Administered 2023-12-01: 1

## 2023-12-01 MED ORDER — ACETAMINOPHEN 10 MG/ML IV SOLN: 1000.0000 mg | Freq: Once | INTRAVENOUS | Status: DC | PRN

## 2023-12-01 MED ORDER — ONDANSETRON HCL 4 MG/2ML IJ SOLN: 4.0000 mg | Freq: Three times a day (TID) | INTRAMUSCULAR | Status: DC | PRN

## 2023-12-01 MED ORDER — LIDOCAINE-EPINEPHRINE (PF) 2 %-1:200000 IJ SOLN
INTRAMUSCULAR | Status: DC | PRN
  Administered 2023-11-30: 3 mL via EPIDURAL
  Administered 2023-12-01 (×2): 5 mL via EPIDURAL
  Administered 2023-12-01: 3 mL via EPIDURAL
  Administered 2023-12-01: 2 mL via EPIDURAL
  Administered 2023-12-01: 5 mL via EPIDURAL

## 2023-12-01 MED ORDER — MENTHOL 3 MG MT LOZG: 1.0000 | LOZENGE | OROMUCOSAL | Status: DC | PRN

## 2023-12-01 MED ORDER — DIBUCAINE (PERIANAL) 1 % EX OINT: 1.0000 | TOPICAL_OINTMENT | CUTANEOUS | Status: DC | PRN

## 2023-12-01 MED ORDER — LACTATED RINGERS IV SOLN: INTRAVENOUS | Status: DC | PRN

## 2023-12-01 MED ORDER — DIPHENHYDRAMINE HCL 25 MG PO CAPS
25.0000 mg | ORAL_CAPSULE | Freq: Four times a day (QID) | ORAL | Status: DC | PRN
  Administered 2023-12-01: 25 mg via ORAL
  Filled 2023-12-01: qty 1

## 2023-12-01 MED ORDER — DEXMEDETOMIDINE HCL IN NACL 80 MCG/20ML IV SOLN
INTRAVENOUS | Status: DC | PRN
Start: 2023-12-01 — End: 2023-12-01
  Administered 2023-12-01 (×2): 8 ug via INTRAVENOUS

## 2023-12-01 MED ORDER — FENTANYL CITRATE (PF) 100 MCG/2ML IJ SOLN: 25.0000 ug | INTRAMUSCULAR | Status: DC | PRN

## 2023-12-01 MED ORDER — TERBUTALINE SULFATE 1 MG/ML IJ SOLN
0.2500 mg | Freq: Once | INTRAMUSCULAR | Status: AC
  Administered 2023-12-01: 0.25 mg via SUBCUTANEOUS

## 2023-12-01 MED ORDER — PHENYLEPHRINE 80 MCG/ML (10ML) SYRINGE FOR IV PUSH (FOR BLOOD PRESSURE SUPPORT)
PREFILLED_SYRINGE | INTRAVENOUS | Status: DC | PRN
  Administered 2023-12-01: 160 ug via INTRAVENOUS

## 2023-12-01 MED ORDER — FENTANYL CITRATE (PF) 100 MCG/2ML IJ SOLN
INTRAMUSCULAR | Status: AC
Start: 2023-12-01 — End: ?
  Filled 2023-12-01: qty 2

## 2023-12-01 MED ORDER — MORPHINE SULFATE (PF) 0.5 MG/ML IJ SOLN
INTRAMUSCULAR | Status: DC | PRN
  Administered 2023-12-01: 3 mg via EPIDURAL

## 2023-12-01 MED ORDER — CEFAZOLIN SODIUM-DEXTROSE 2-4 GM/100ML-% IV SOLN
2.0000 g | Freq: Three times a day (TID) | INTRAVENOUS | Status: AC
Start: 2023-12-01 — End: 2023-12-02
  Administered 2023-12-01 – 2023-12-02 (×3): 2 g via INTRAVENOUS
  Filled 2023-12-01 (×3): qty 100

## 2023-12-01 MED ORDER — ACETAMINOPHEN 325 MG PO TABS: 325.0000 mg | ORAL_TABLET | ORAL | Status: DC | PRN

## 2023-12-01 MED ORDER — NALOXONE HCL 0.4 MG/ML IJ SOLN: 0.4000 mg | INTRAMUSCULAR | Status: DC | PRN

## 2023-12-01 MED ORDER — STERILE WATER FOR IRRIGATION IR SOLN
Status: DC | PRN
  Administered 2023-12-01: 1000 mL

## 2023-12-01 MED ORDER — SIMETHICONE 80 MG PO CHEW: 80.0000 mg | CHEWABLE_TABLET | ORAL | Status: DC | PRN

## 2023-12-01 MED ORDER — SODIUM CHLORIDE 0.9 % IV BOLUS
1000.0000 mL | Freq: Once | INTRAVENOUS | Status: AC
Start: 2023-12-01 — End: 2023-12-01
  Administered 2023-12-01: 1000 mL via INTRAVENOUS

## 2023-12-01 MED ORDER — ACETAMINOPHEN 10 MG/ML IV SOLN
INTRAVENOUS | Status: DC | PRN
  Administered 2023-12-01: 1000 mg via INTRAVENOUS

## 2023-12-01 MED ORDER — TRANEXAMIC ACID-NACL 1000-0.7 MG/100ML-% IV SOLN: 1000.0000 mg | Freq: Once | INTRAVENOUS | Status: DC

## 2023-12-01 MED ORDER — SOD CITRATE-CITRIC ACID 500-334 MG/5ML PO SOLN: 30.0000 mL | ORAL | Status: DC

## 2023-12-01 MED ORDER — ONDANSETRON HCL 4 MG/2ML IJ SOLN
INTRAMUSCULAR | Status: AC
  Filled 2023-12-01: qty 2

## 2023-12-01 MED ORDER — FENTANYL CITRATE (PF) 100 MCG/2ML IJ SOLN
INTRAMUSCULAR | Status: DC | PRN
Start: 2023-12-01 — End: 2023-12-01
  Administered 2023-12-01 (×2): 100 ug via INTRAVENOUS

## 2023-12-01 MED ORDER — DEXAMETHASONE SODIUM PHOSPHATE 10 MG/ML IJ SOLN
INTRAMUSCULAR | Status: DC | PRN
  Administered 2023-12-01: 10 mg via INTRAVENOUS

## 2023-12-01 SURGICAL SUPPLY — 25 items
CANISTER WOUND CARE 500ML ATS (WOUND CARE) IMPLANT
DRESSING PREVENA PLUS CUSTOM (GAUZE/BANDAGES/DRESSINGS) IMPLANT
DRSG PREVENA PLUS CUSTOM (GAUZE/BANDAGES/DRESSINGS) ×1 IMPLANT
ELECT REM PT RETURN 9FT ADLT (ELECTROSURGICAL) ×1 IMPLANT
ELECTRODE REM PT RTRN 9FT ADLT (ELECTROSURGICAL) ×1 IMPLANT
EXTENDER TRAXI PANNICULUS (MISCELLANEOUS) IMPLANT
GLOVE SURG ORTHO 8.0 STRL STRW (GLOVE) ×1 IMPLANT
GOWN STRL REUS W/TWL LRG LVL3 (GOWN DISPOSABLE) ×2 IMPLANT
KIT ABG SYR 3ML LUER SLIP (SYRINGE) IMPLANT
NDL HYPO 25X5/8 SAFETYGLIDE (NEEDLE) IMPLANT
NEEDLE HYPO 25X5/8 SAFETYGLIDE (NEEDLE) IMPLANT
NS IRRIG 1000ML POUR BTL (IV SOLUTION) ×1 IMPLANT
PACK C SECTION WH (CUSTOM PROCEDURE TRAY) ×1 IMPLANT
PAD OB MATERNITY 4.3X12.25 (PERSONAL CARE ITEMS) ×1 IMPLANT
RETRACTOR TRAXI PANNICULUS (MISCELLANEOUS) IMPLANT
RTRCTR C-SECT PINK 25CM LRG (MISCELLANEOUS) IMPLANT
SUT MON AB-0 CT1 36 (SUTURE) ×2 IMPLANT
SUT PDS AB 0 CTX 36 PDP370T (SUTURE) IMPLANT
SUT PLAIN 0 NONE (SUTURE) IMPLANT
SUT VIC AB 0 CT1 27XBRD ANBCTR (SUTURE) ×2 IMPLANT
SUT VIC AB 2-0 CT1 (SUTURE) IMPLANT
SUT VIC AB 2-0 CT1 TAPERPNT 27 (SUTURE) ×1 IMPLANT
SUT VIC AB 4-0 SH 27XANBCTRL (SUTURE) ×1 IMPLANT
TOWEL OR 17X24 6PK STRL BLUE (TOWEL DISPOSABLE) ×1 IMPLANT
WATER STERILE IRR 1000ML POUR (IV SOLUTION) ×1 IMPLANT

## 2023-12-01 NOTE — Progress Notes (Signed)
 CBC collected

## 2023-12-01 NOTE — Progress Notes (Signed)
Pre surgical counseling note:   Pt seen prior to cesarean section.  Pt had been having repetitive late decelerations.  MVUs were not adequate and cervical change had been nonexist.  Pitocin was restarted and late/variable decelerations were noted .  Decision was made to proceed with cesarean delivery.    The risks of surgery were discussed with the patient including but were not limited to: bleeding which may require transfusion or reoperation; infection which may require antibiotics; injury to bowel, bladder, ureters or other surrounding organs; injury to the fetus; need for additional procedures including hysterectomy in the event of a life-threatening hemorrhage; formation of adhesions; placental abnormalities wth subsequent pregnancies; incisional problems; thromboembolic phenomenon and other postoperative/anesthesia complications.  The patient concurred with the proposed plan, giving informed written consent for the procedure.   Patient has been NPO since admission she will remain NPO for procedure. Anesthesia and OR aware. Preoperative prophylactic antibiotics and SCDs ordered on call to the OR. Pt is allergic to azithromycin.  Pharmacist called and she noted there is no alternative antibiotic for this indication. To OR when ready.   Mariel Aloe, MD

## 2023-12-01 NOTE — Anesthesia Procedure Notes (Signed)
Epidural Patient location during procedure: OB Start time: 11/30/2023 11:50 PM End time: 11/30/2023 11:55 PM  Staffing Anesthesiologist: Shelton Silvas, MD Performed: anesthesiologist   Preanesthetic Checklist Completed: patient identified, IV checked, site marked, risks and benefits discussed, surgical consent, monitors and equipment checked, pre-op evaluation and timeout performed  Epidural Patient position: sitting Prep: DuraPrep Patient monitoring: heart rate, continuous pulse ox and blood pressure Approach: midline Location: L3-L4 Injection technique: LOR saline  Needle:  Needle type: Tuohy  Needle gauge: 17 G Needle length: 9 cm Catheter type: closed end flexible Catheter size: 20 Guage Test dose: negative and 1.5% lidocaine  Assessment Events: blood not aspirated, no cerebrospinal fluid, injection not painful, no injection resistance and no paresthesia  Additional Notes LOR @ 7  Patient identified. Risks/Benefits/Options discussed with patient including but not limited to bleeding, infection, nerve damage, paralysis, failed block, incomplete pain control, headache, blood pressure changes, nausea, vomiting, reactions to medications, itching and postpartum back pain. Confirmed with bedside nurse the patient's most recent platelet count. Confirmed with patient that they are not currently taking any anticoagulation, have any bleeding history or any family history of bleeding disorders. Patient expressed understanding and wished to proceed. All questions were answered. Sterile technique was used throughout the entire procedure. Please see nursing notes for vital signs. Test dose was given through epidural catheter and negative prior to continuing to dose epidural or start infusion. Warning signs of high block given to the patient including shortness of breath, tingling/numbness in hands, complete motor block, or any concerning symptoms with instructions to call for help. Patient  was given instructions on fall risk and not to get out of bed. All questions and concerns addressed with instructions to call with any issues or inadequate analgesia.    Reason for block:procedure for pain

## 2023-12-01 NOTE — Op Note (Signed)
Jamie Archer PROCEDURE DATE: 12/01/2023  PREOPERATIVE DIAGNOSES: Intrauterine pregnancy at [redacted]w[redacted]d weeks gestation; failure to progress: arrest of dilation and non-reassuring fetal status  POSTOPERATIVE DIAGNOSES: The same  PROCEDURE: Primary Low Transverse Cesarean Section  SURGEON:  Dr. Mariel Aloe  ASSISTANT:  Wylene Simmer, MD  ANESTHESIOLOGY TEAM: Anesthesiologist: Shelton Silvas, MD CRNA: Claudina Lick, CRNA; Orlie Pollen, CRNA  INDICATIONS: Jamie Archer is a 34 y.o. G1P1001 at [redacted]w[redacted]d here for cesarean section secondary to the indications listed under preoperative diagnoses; please see preoperative note for further details.  The risks of surgery were discussed with the patient including but were not limited to: bleeding which may require transfusion or reoperation; infection which may require antibiotics; injury to bowel, bladder, ureters or other surrounding organs; injury to the fetus; need for additional procedures including hysterectomy in the event of a life-threatening hemorrhage; formation of adhesions; placental abnormalities wth subsequent pregnancies; incisional problems; thromboembolic phenomenon and other postoperative/anesthesia complications.  The patient concurred with the proposed plan, giving informed written consent for the procedure.    An experienced assistant was required given the standard of surgical care given the complexity of the case.  This assistant was needed for exposure, dissection, suctioning, retraction, instrument exchange,  assisting with delivery with administration of fundal pressure, and for overall help during the procedure.    FINDINGS:  Viable female infant in cephalic presentation.  Apgars 8 and 9.  Clear amniotic fluid.  Intact placenta, three vessel cord.  Normal uterus, fallopian tubes and ovaries bilaterally.  ANESTHESIA: Epidural  INTRAVENOUS FLUIDS: 1300 ml   ESTIMATED BLOOD LOSS: 250 ml URINE OUTPUT:  75 ml SPECIMENS:  Placenta sent to pathology COMPLICATIONS: None immediate  PROCEDURE IN DETAIL:  The patient preoperatively received intravenous antibiotics and had sequential compression devices applied to her lower extremities.  She was then taken to the operating room where the epidural anesthesia was dosed up to surgical level and was found to be adequate. She was then placed in a dorsal supine position with a leftward tilt, and prepped and draped in a sterile manner.  A foley catheter was placed into her bladder and attached to constant gravity.  After an adequate timeout was performed, a Pfannenstiel skin incision was made with scalpel two fingerbreaths above the pubic symphysis and carried through to the underlying layer of fascia. The fascia was incised in the midline, and this incision was extended bilaterally using the Mayo scissors.  Kocher clamps x 2 were applied to the superior aspect of the fascial incision and the underlying rectus muscles were dissected off bluntly and sharply.  A similar process was carried out on the inferior aspect of the fascial incision. The rectus muscles were separated in the midline and the peritoneum was entered bluntly. The Alexis self-retaining retractor was introduced into the abdominal cavity.    Attention was turned to the lower uterine segment where a low transverse hysterotomy was made with a scalpel and extended bilaterally bluntly.  The infant was successfully delivered, the cord was clamped and cut after one minute, and the infant was handed over to the awaiting neonatology team. Uterine massage was then administered, and the placenta delivered intact with a three-vessel cord. The uterus was then cleared of clots and debris using manual curettage.  The uterine incision was closed with 0 vicryl in a running locked fashion, and an imbricating layer was also placed with 0 vicryl.  The pelvis was cleared of all clot and debris with irrigation and suction. Hemostasis  was confirmed on  all surfaces.  The retractor was removed.  The peritoneum was closed with a 2-0 Vicryl running stitch. The fascia was then closed using 0 PDS in a running fashion.  The subcutaneous layer was irrigated, reapproximated with 2-0 vicryl running stitches, and the skin was closed with a 4-0 Vicryl subcuticular stitch. The patient tolerated the procedure well. Sponge, instrument and needle counts were correct x 3.  She was taken to the recovery room in stable condition.   Preveena wound vac was applied to the incision after wound closure.   Mariel Aloe, MD, FACOG Obstetrician & Gynecologist, [facility] for [facility], [facility]

## 2023-12-01 NOTE — Progress Notes (Signed)
Patient ID: Jamie Archer, female   DOB: 11-01-1990, 33 y.o.   MRN: [id number]  Comfortable w epidural  BPs 124/59, 120/49 FHR 130-140s, good variability, recurrent variables/late at times, +accels, +SS Ctx irreg 5-7 mins with suboptimal MVUs (~100); amnioinfusion going Cx unchanged (4+/90/vtx -3)  IUP@ 39.0wks gHTN IOL process  Discussion with Bass> since ctx are spaced out, we are in a holding pattern in terms of labor; since FHR has decent variability she wouldn't meet qualification for a C/S currently; reviewed recommendation with pt to start Pitocin back slowly to try to get adequate MVUs while at the same time keeping the FHR stable; she is aware that this may not be possible and she may require a C/S for delivery. She understands and is agreeable to starting Pitocin. Will continue to follow closely,  [name] [facility] 12/01/2023 2:49 AM

## 2023-12-01 NOTE — Transfer of Care (Signed)
Immediate Anesthesia Transfer of Care Note  Patient: [place] Jordahl  Procedure(s) Performed: CESAREAN SECTION (Abdomen) APPLICATION OF WOUND VAC (Abdomen)  Patient Location: PACU  Anesthesia Type:Epidural  Level of Consciousness: awake, alert , and oriented  Airway & Oxygen Therapy: Patient Spontanous Breathing  Post-op Assessment: Report given to RN and Post -op Vital signs reviewed and stable  Post vital signs: Reviewed and stable  Last Vitals:  Vitals Value Taken Time  BP 116/56 12/01/23 0630  Temp    Pulse 69 12/01/23 0639  Resp 11 12/01/23 0639  SpO2 97 % 12/01/23 0639  Vitals shown include unfiled device data.  Last Pain:  Vitals:   12/01/23 0425  TempSrc: Axillary  PainSc:          Complications: No notable events documented.

## 2023-12-02 LAB — CBC
HCT: 24.2 % — ABNORMAL LOW (ref 36.0–46.0)
Hemoglobin: 8 g/dL — ABNORMAL LOW (ref 12.0–15.0)
MCH: 32.5 pg (ref 26.0–34.0)
MCHC: 33.1 g/dL (ref 30.0–36.0)
MCV: 98.4 fL (ref 80.0–100.0)
Platelets: 226 10*3/uL (ref 150–400)
RBC: 2.46 MIL/uL — ABNORMAL LOW (ref 3.87–5.11)
RDW: 16.4 % — ABNORMAL HIGH (ref 11.5–15.5)
WBC: 12.9 10*3/uL — ABNORMAL HIGH (ref 4.0–10.5)
nRBC: 0 % (ref 0.0–0.2)

## 2023-12-02 LAB — SURGICAL PATHOLOGY

## 2023-12-02 MED ORDER — FERROUS SULFATE 325 (65 FE) MG PO TABS
325.0000 mg | ORAL_TABLET | ORAL | Status: DC
  Administered 2023-12-02: 325 mg via ORAL
  Filled 2023-12-02: qty 1

## 2023-12-02 MED ORDER — TOPIRAMATE 100 MG PO TABS
100.0000 mg | ORAL_TABLET | Freq: Two times a day (BID) | ORAL | Status: DC
  Administered 2023-12-02 – 2023-12-03 (×2): 100 mg via ORAL
  Filled 2023-12-02 (×3): qty 1

## 2023-12-02 NOTE — Anesthesia Postprocedure Evaluation (Signed)
Anesthesia Post Note  Patient: Jamie Archer  Procedure(s) Performed: CESAREAN SECTION (Abdomen) APPLICATION OF WOUND VAC (Abdomen)     Patient location during evaluation: Mother Baby Anesthesia Type: Epidural Level of consciousness: awake and alert Pain management: pain level controlled Vital Signs Assessment: post-procedure vital signs reviewed and stable Respiratory status: spontaneous breathing, nonlabored ventilation and respiratory function stable Cardiovascular status: stable Postop Assessment: no headache, no backache and epidural receding Anesthetic complications: no   No notable events documented.              Shelton Silvas

## 2023-12-02 NOTE — Progress Notes (Signed)
POSTPARTUM PROGRESS NOTE  Subjective: Jamie Archer is a 33 y.o. G1P1001 s/p C-section at [redacted]w[redacted]d.  She reports she is doing well. No acute events overnight. She denies any problems with ambulating, voiding or po intake. Denies nausea or vomiting. She has passed flatus. Pain is well controlled.  Lochia is minimal.  Objective: Blood pressure 125/78, pulse (!) 52, temperature 98 F (36.7 C), temperature source Oral, resp. rate 16, height 5\' 6"  (1.676 m), weight (!) 145 kg, last menstrual period 02/25/2023, SpO2 100%, unknown if currently breastfeeding.  Physical Exam:  General: alert, cooperative and no distress Chest: no respiratory distress Abdomen: soft, non-tender  Uterine Fundus: firm and at level of umbilicus Extremities: No calf swelling or tenderness  No edema  Recent Labs    12/01/23 0649 12/02/23 0609  HGB 8.4* 8.0*  HCT 25.6* 24.2*    Assessment/Plan: Jamie Archer is a 33 y.o. G1P1001 s/p C-section at [redacted]w[redacted]d for IOL for gHTN at [redacted]w[redacted]d requiring C-section d/t failure to progress 2/2 arrest of dilation and non-reassuring fetal status.  Routine Postpartum Care: Doing well, pain well-controlled.  -- Continue routine care, lactation support  -- Contraception: OCP or Nexplanon -- Feeding: BF  Dispo: Plan for discharge tomorrow.  Fortunato Curling, DO PGY-1 12/02/2023 8:28 AM

## 2023-12-02 NOTE — Progress Notes (Signed)
POSTPARTUM PROGRESS NOTE  Post op Day 1 Subjective:  Jamie Archer is a 33 y.o. G1P1001 [redacted]w[redacted]d s/p pltcs.  No acute events overnight.  Pt denies problems with ambulating, voiding or po intake.  She denies nausea or vomiting.  Pain is well controlled.  She has had flatus. She has not had bowel movement.  Lochia Small.   Objective: Blood pressure 125/78, pulse (!) 52, temperature 98 F (36.7 C), temperature source Oral, resp. rate 16, height 5\' 6"  (1.676 m), weight (!) 145 kg, last menstrual period 02/25/2023, SpO2 100%, unknown if currently breastfeeding.  Physical Exam:  General: alert, cooperative and no distress Lochia:normal flow Chest: CTAB Heart: RRR no m/r/g Abdomen: +BS, soft, nontender,  Uterine Fundus: firm, prevena intact DVT Evaluation: No calf swelling or tenderness Extremities: trace edema  Recent Labs    12/01/23 0649 12/02/23 0609  HGB 8.4* 8.0*  HCT 25.6* 24.2*    Assessment/Plan:  ASSESSMENT: Jamie Archer is a 33 y.o. G1P1001 [redacted]w[redacted]d s/p pltcs, doing well. Bps well controlled, holding on lasix as uop was poor yesterday (today normal). Hgb 8, asymptomatic, will start oral iron. Has prevena wound vac. Breast and bottle feeding. Plan per neurology was to reduce topamax to pre pregnancy dose of 100 bid after delivery so will start that dose. Plan for d/c tomorrow or next day.   LOS: 2 days   Silvano Bilis 12/02/2023, 9:49 AM

## 2023-12-03 ENCOUNTER — Other Ambulatory Visit (HOSPITAL_COMMUNITY): Payer: Self-pay

## 2023-12-03 MED ORDER — IBUPROFEN 600 MG PO TABS
600.0000 mg | ORAL_TABLET | Freq: Four times a day (QID) | ORAL | 1 refills | Status: DC
  Filled 2023-12-03: qty 60, 15d supply, fill #0

## 2023-12-03 MED ORDER — ACETAMINOPHEN 325 MG PO TABS
650.0000 mg | ORAL_TABLET | ORAL | 1 refills | Status: DC | PRN
  Filled 2023-12-03: qty 60, 5d supply, fill #0

## 2023-12-03 MED ORDER — MEASLES, MUMPS & RUBELLA VAC IJ SOLR
0.5000 mL | Freq: Once | INTRAMUSCULAR | Status: AC
  Administered 2023-12-03: 0.5 mL via SUBCUTANEOUS
  Filled 2023-12-03: qty 0.5

## 2023-12-03 MED ORDER — FUROSEMIDE 40 MG PO TABS
40.0000 mg | ORAL_TABLET | Freq: Every day | ORAL | 0 refills | Status: DC
  Filled 2023-12-03: qty 5, 5d supply, fill #0

## 2023-12-03 MED ORDER — SENNOSIDES-DOCUSATE SODIUM 8.6-50 MG PO TABS
2.0000 | ORAL_TABLET | Freq: Every day | ORAL | 1 refills | Status: DC
  Filled 2023-12-03: qty 60, 30d supply, fill #0

## 2023-12-03 MED ORDER — TOPIRAMATE 100 MG PO TABS
100.0000 mg | ORAL_TABLET | Freq: Two times a day (BID) | ORAL | 0 refills | Status: DC
  Filled 2023-12-03: qty 60, 30d supply, fill #0

## 2023-12-03 MED ORDER — OXYCODONE HCL 5 MG PO TABS
5.0000 mg | ORAL_TABLET | Freq: Four times a day (QID) | ORAL | 0 refills | Status: DC | PRN
  Filled 2023-12-03: qty 15, 4d supply, fill #0

## 2023-12-03 MED ORDER — POTASSIUM CHLORIDE CRYS ER 20 MEQ PO TBCR
20.0000 meq | EXTENDED_RELEASE_TABLET | Freq: Every day | ORAL | 0 refills | Status: DC
  Filled 2023-12-03: qty 5, 5d supply, fill #0

## 2023-12-03 NOTE — Discharge Summary (Signed)
 Postpartum Discharge Summary  Date of Service updated     Patient Name: Jamie Archer DOB: 1991-01-31 MRN: [id number]  Date of admission: 11/30/2023 Delivery date:12/01/2023 Delivering provider: Warden Fillers Date of discharge: 12/03/2023  Admitting diagnosis: Pregnancy [Z34.90] Intrauterine pregnancy: [redacted]w[redacted]d     Secondary diagnosis:  Principal Problem:   Status post primary low transverse cesarean section Active Problems:   Supervision of high risk pregnancy, antepartum   Obesity in pregnancy   Seizure disorder during pregnancy, antepartum (HCC)   Rubella non-immune status, antepartum   Polyhydramnios affecting pregnancy   Gestational hypertension  Additional problems: None    Discharge diagnosis: Term Pregnancy Delivered and Gestational Hypertension                                              Post partum procedures: None Augmentation: AROM, Pitocin, Cytotec, and IP Foley Complications: None  Hospital course: Induction of Labor With Cesarean Section   33 y.o. yo G1P1001 at [redacted]w[redacted]d was admitted to the hospital 11/30/2023 for induction of labor 2/2 gHTN. Patient had a labor course significant for fetal intolerance and inability to augment into active labor. The patient went for cesarean section due to Non-Reassuring FHR. Delivery details are as follows: Membrane Rupture Time/Date: 7:37 PM,11/30/2023  Delivery Method:C-Section, Low Transverse Operative Delivery: N/A Details of operation can be found in separate operative Note.  Patient had a postpartum course complicated by none. Normotensive. Lasix and potassium started PP. She is ambulating, tolerating a regular diet, passing flatus, and urinating well.  Patient is discharged home in stable condition on 12/03/23.      Newborn Data: Birth date:12/01/2023 Birth time:5:22 AM Gender:Female Living status:Living Apgars:8 ,9  Weight:2560 g                               Magnesium Sulfate received: No BMZ received:  No Rhophylac:No MMR:Yes T-DaP:Given prenatally Flu: No RSV Vaccine received: No Transfusion:No  Immunizations received: Immunization History  Administered Date(s) Administered   Tdap 10/19/2023    Physical exam  Vitals:   12/02/23 0159 12/02/23 1433 12/02/23 2030 12/03/23 0542  BP: 125/78 131/82 133/75 132/84  Pulse: (!) 52 (!) 57 63 61  Resp: 16 17 18 18   Temp: 98 F (36.7 C) 98.1 F (36.7 C) 98.8 F (37.1 C) 98.4 F (36.9 C)  TempSrc: Oral Oral Oral Oral  SpO2: 100% 100% 100% 100%  Weight:      Height:       General: alert, cooperative, and no distress Lochia: appropriate Uterine Fundus: firm Incision: Prevena in place. No drainage in tubing DVT Evaluation: No evidence of DVT seen on physical exam. Labs: Lab Results  Component Value Date   WBC 12.9 (H) 12/02/2023   HGB 8.0 (L) 12/02/2023   HCT 24.2 (L) 12/02/2023   MCV 98.4 12/02/2023   PLT 226 12/02/2023      Latest Ref Rng & Units 11/30/2023   11:17 AM  CMP  Glucose 70 - 99 mg/dL 80   BUN 6 - 20 mg/dL 7   Creatinine 4.09 - 8.11 mg/dL 9.14   Sodium 782 - 956 mmol/L 136   Potassium 3.5 - 5.1 mmol/L 4.0   Chloride 98 - 111 mmol/L 110   CO2 22 - 32 mmol/L 19   Calcium 8.9 -  10.3 mg/dL 8.3   Total Protein 6.5 - 8.1 g/dL 5.6   Total Bilirubin 0.0 - 1.2 mg/dL 0.3   Alkaline Phos 38 - 126 U/L 92   AST 15 - 41 U/L 9   ALT 0 - 44 U/L 6    Edinburgh Score:    12/02/2023   10:35 AM  Edinburgh Postnatal Depression Scale Screening Tool  I have been able to laugh and see the funny side of things. 0  I have looked forward with enjoyment to things. 0  I have blamed myself unnecessarily when things went wrong. 1  I have been anxious or worried for no good reason. 1  I have felt scared or panicky for no good reason. 0  Things have been getting on top of me. 0  I have been so unhappy that I have had difficulty sleeping. 0  I have felt sad or miserable. 0  I have been so unhappy that I have been crying. 0  The  thought of harming myself has occurred to me. 0  Edinburgh Postnatal Depression Scale Total 2   Edinburgh Postnatal Depression Scale Total: 2   After visit meds:  Allergies as of 12/03/2023       Reactions   Clindamycin/lincomycin    rash   Lacosamide Nausea And Vomiting   Azithromycin Nausea And Vomiting, Rash   Codeine Nausea And Vomiting, Rash   Penicillins Nausea And Vomiting, Rash        Medication List     STOP taking these medications    oxyCODONE-acetaminophen 5-325 MG tablet Commonly known as: PERCOCET/ROXICET   pantoprazole 20 MG tablet Commonly known as: Protonix       TAKE these medications    acetaminophen 325 MG tablet Commonly known as: TYLENOL Take 2 tablets (650 mg total) by mouth every 4 (four) hours as needed for mild pain (pain score 1-3) (temperature > 101.5.).   ferrous sulfate 325 (65 FE) MG EC tablet Take 1 tablet (325 mg total) by mouth every other day.   furosemide 40 MG tablet Commonly known as: Lasix Take 1 tablet (40 mg total) by mouth daily.   ibuprofen 600 MG tablet Commonly known as: ADVIL Take 1 tablet (600 mg total) by mouth every 6 (six) hours.   oxyCODONE 5 MG immediate release tablet Commonly known as: Oxy IR/ROXICODONE Take 1 tablet (5 mg total) by mouth every 6 (six) hours as needed for moderate pain (pain score 4-6).   potassium chloride SA 20 MEQ tablet Commonly known as: KLOR-CON M Take 1 tablet (20 mEq total) by mouth daily.   prenatal multivitamin Tabs tablet Take 1 tablet by mouth daily at 12 noon.   senna-docusate 8.6-50 MG tablet Commonly known as: Senokot-S Take 2 tablets by mouth daily.   topiramate 100 MG tablet Commonly known as: TOPAMAX Take 1 tablet (100 mg total) by mouth 2 (two) times daily. What changed:  medication strength how much to take how to take this when to take this additional instructions         Discharge home in stable condition Infant Feeding: Breast Infant  Disposition:home with mother Discharge instruction: per After Visit Summary and Postpartum booklet. Activity: Advance as tolerated. Pelvic rest for 6 weeks.  Diet: routine diet Future Appointments: Future Appointments  Date Time Provider Department Center  12/08/2023  9:00 AM [facility] NURSE [facility] None  01/19/2024  9:15 AM Levie Heritage, DO [facility] None   Follow up Visit:  Message sent to [facility]  2/22  Please schedule this patient for a In person postpartum visit in 6 weeks with the following provider: Any provider. Additional Postpartum F/U:Incision check 1 week and BP check 1 week  High risk pregnancy complicated by:  seizure disorder, gHTN, poly Delivery mode:  C-Section, Low Transverse Anticipated Birth Control:   Nexplanon or POPs   12/03/2023 Joanne Gavel, MD

## 2023-12-07 ENCOUNTER — Encounter: Payer: BC Managed Care – PPO | Admitting: Family Medicine

## 2023-12-08 ENCOUNTER — Ambulatory Visit: Payer: BC Managed Care – PPO

## 2023-12-08 NOTE — Progress Notes (Signed)
 Subjective:     Jamie Archer is a 33 y.o. female who presents to the clinic 1 weeks status post low uterine, transverse cesarean section. Pt reports incision is healing well.      Objective:    Wt 293 lb (132.9 kg)   LMP 02/25/2023 (Exact Date)   Breastfeeding No   BMI 47.29 kg/m  General:  alert, well appearing, in no apparent distress  Incision:   healing well, no drainage, no erythema, no hernia, no seroma, no swelling, no dehiscence, incision well approximated     Assessment:    Doing well postoperatively.   Plan:    1. Continue any current medications. 2. Wound care discussed. 3. Follow up: 01/19/24.   Danna Hefty, RN

## 2023-12-10 ENCOUNTER — Telehealth (HOSPITAL_COMMUNITY): Payer: Self-pay

## 2023-12-10 NOTE — Telephone Encounter (Signed)
 12/10/2023 1203  Name: Jamie Archer MRN: [id number] DOB: July 28, 1991  Reason for Call:  Transition of Care Hospital Discharge Call  Contact Status: Patient Contact Status: Message  Language assistant needed:          Follow-Up Questions:    Inocente Salles Postnatal Depression Scale:  In the Past 7 Days:    PHQ2-9 Depression Scale:     Discharge Follow-up:    Post-discharge interventions: NA  Signature  Signe Colt

## 2023-12-12 ENCOUNTER — Encounter: Payer: Self-pay | Admitting: Family Medicine

## 2024-01-19 ENCOUNTER — Ambulatory Visit (INDEPENDENT_AMBULATORY_CARE_PROVIDER_SITE_OTHER): Payer: BC Managed Care – PPO | Admitting: Family Medicine

## 2024-01-19 DIAGNOSIS — O135 Gestational [pregnancy-induced] hypertension without significant proteinuria, complicating the puerperium: Secondary | ICD-10-CM

## 2024-01-19 DIAGNOSIS — Z1339 Encounter for screening examination for other mental health and behavioral disorders: Secondary | ICD-10-CM | POA: Diagnosis not present

## 2024-01-19 DIAGNOSIS — Z3202 Encounter for pregnancy test, result negative: Secondary | ICD-10-CM | POA: Diagnosis not present

## 2024-01-19 DIAGNOSIS — Z30017 Encounter for initial prescription of implantable subdermal contraceptive: Secondary | ICD-10-CM | POA: Diagnosis not present

## 2024-01-19 DIAGNOSIS — O139 Gestational [pregnancy-induced] hypertension without significant proteinuria, unspecified trimester: Secondary | ICD-10-CM

## 2024-01-19 LAB — POCT URINE PREGNANCY: Preg Test, Ur: NEGATIVE

## 2024-01-19 MED ORDER — ETONOGESTREL 68 MG ~~LOC~~ IMPL
68.0000 mg | DRUG_IMPLANT | Freq: Once | SUBCUTANEOUS | Status: AC
  Administered 2024-01-19: 68 mg via SUBCUTANEOUS

## 2024-01-19 NOTE — Progress Notes (Signed)
 Post Partum Visit Note  Jamie Archer is a 33 y.o. G78P1001 female who presents for a postpartum visit. She is 7 weeks postpartum following a primary cesarean section.  I have fully reviewed the prenatal and intrapartum course. The delivery was at 39 gestational weeks.  Anesthesia: epidural. Postpartum course has been normal. Baby is doing well. Baby is feeding by bottle - Similac Neosure. Bleeding no bleeding. Bowel function is normal. Bladder function is normal. Patient is not sexually active. Contraception method is Nexplanon. Postpartum depression screening: negative.   The pregnancy intention screening data noted above was reviewed. Potential methods of contraception were discussed. The patient elected to proceed with No data recorded.   Edinburgh Postnatal Depression Scale - 01/19/24 0921       Edinburgh Postnatal Depression Scale:  In the Past 7 Days   I have been able to laugh and see the funny side of things. 0    I have looked forward with enjoyment to things. 0    I have blamed myself unnecessarily when things went wrong. 0    I have been anxious or worried for no good reason. 0    I have felt scared or panicky for no good reason. 0    Things have been getting on top of me. 1    I have been so unhappy that I have had difficulty sleeping. 0    I have felt sad or miserable. 0    I have been so unhappy that I have been crying. 0    The thought of harming myself has occurred to me. 0    Edinburgh Postnatal Depression Scale Total 1             Health Maintenance Due  Topic Date Due   Cervical Cancer Screening (HPV/Pap Cotest)  Never done   COVID-19 Vaccine (1 - 2024-25 season) Never done    The following portions of the patient's history were reviewed and updated as appropriate: allergies, current medications, past family history, past medical history, past social history, past surgical history, and problem list.  Review of Systems Pertinent items are noted in  HPI.  Objective:  BP 138/82 (BP Location: Left Arm, Patient Position: Sitting, Cuff Size: Large)   Pulse (!) 59   Wt 276 lb (125.2 kg)   LMP 01/06/2023 (Exact Date)   Breastfeeding No   BMI 44.55 kg/m    General:  alert, cooperative, and no distress   Breasts:  not indicated  Lungs: clear to auscultation bilaterally  Heart:  regular rate and rhythm, S1, S2 normal, no murmur, click, rub or gallop  Abdomen: soft, non-tender; bowel sounds normal; no masses,  no organomegaly   Wound well approximated incision  GU exam:  not indicated        Nexplanon Insertion:  Patient given informed consent, signed copy in the chart, time out was performed. Pregnancy test was negative. Appropriate time out taken.  Patient's left arm was prepped and draped in the usual sterile fashion.. The ruler used to measure and mark insertion area.  Pt was prepped with alcohol swab and then injected with 5 cc of 2% lidocaine with epinephrine.  Pt was prepped with betadine, Implanon removed form packaging,  Device confirmed in needle, then inserted full length of needle and withdrawn per handbook instructions.  Device palpated by physician and patient.  Pt insertion site covered with pressure dressing.   Minimal blood loss.  Pt tolerated the procedure well.    Assessment:  1. Postpartum care and examination (Primary) - POCT urine pregnancy - etonogestrel (NEXPLANON) implant 68 mg  2. Encounter for initial prescription of Nexplanon - POCT urine pregnancy - etonogestrel (NEXPLANON) implant 68 mg  3. Gestational hypertension, antepartum BP normal   Plan:   Essential components of care per ACOG recommendations:  1.  Mood and well being: Patient with negative depression screening today. Reviewed local resources for support.  - Patient tobacco use? No.   - hx of drug use? No.    2. Infant care and feeding:  -Patient currently breastmilk feeding? Yes. Reviewed importance of draining breast regularly to  support lactation.  -Social determinants of health (SDOH) reviewed in EPIC. No concerns  3. Sexuality, contraception and birth spacing - Patient does not want a pregnancy in the next year. - Reviewed reproductive life planning. Reviewed contraceptive methods based on pt preferences and effectiveness.  Patient desired Hormonal Implant today.   - Discussed birth spacing of 18 months  4. Sleep and fatigue -Encouraged family/partner/community support of 4 hrs of uninterrupted sleep to help with mood and fatigue  5. Physical Recovery  - Discussed patients delivery and complications. She describes her labor as good. - Patient had a C-section.  Patient expressed understanding - Patient has urinary incontinence? No. - Patient is safe to resume physical and sexual activity  6.  Health Maintenance - HM due items addressed Yes - Last pap smear No results found for: "DIAGPAP" Pap smear not done at today's visit.  -Breast Cancer screening indicated? No.   7. Chronic Disease/Pregnancy Condition follow up: None  - PCP follow up  Levie Heritage, DO Center for [facility], [facility]

## 2024-02-17 ENCOUNTER — Encounter: Payer: Self-pay | Admitting: Family Medicine

## 2024-02-17 MED ORDER — MEGESTROL ACETATE 40 MG PO TABS: 40.0000 mg | ORAL_TABLET | Freq: Two times a day (BID) | ORAL | 5 refills | Status: AC

## 2024-03-14 ENCOUNTER — Encounter: Payer: Self-pay | Admitting: Family Medicine

## 2024-03-20 ENCOUNTER — Ambulatory Visit

## 2024-03-21 ENCOUNTER — Other Ambulatory Visit: Payer: Self-pay | Admitting: Neurology

## 2024-03-28 ENCOUNTER — Ambulatory Visit (INDEPENDENT_AMBULATORY_CARE_PROVIDER_SITE_OTHER): Admitting: Obstetrics and Gynecology

## 2024-03-28 VITALS — BP 136/79 | HR 88 | Wt 270.0 lb

## 2024-03-28 DIAGNOSIS — R1909 Other intra-abdominal and pelvic swelling, mass and lump: Secondary | ICD-10-CM | POA: Diagnosis not present

## 2024-03-28 NOTE — Progress Notes (Signed)
   ESTABLISHED GYNECOLOGY VISIT No chief complaint on file.   Subjective:  Jamie Archer is a 33 y.o. G1P1001 presenting for lump in her groin.  Reports she first noticed the lump during her pregnancy (gave birth 11/2023) and it was small. It then got bigger this past week. Feels it on the left side. Denies any fevers, chills. Reports that it was uncomfortable but now has gone down in size.   Review of Systems:   Pertinent items are noted in HPI  Pertinent History Reviewed:  Reviewed past medical,surgical, social and family history.  Reviewed problem list, medications and allergies.  Objective:   Vitals:   03/28/24 1113  BP: 136/79  Pulse: 88  Weight: 270 lb (122.5 kg)   Physical Examination:   General appearance - well appearing, and in no distress  Mental status - alert, oriented to person, place, and time  Psych:  normal mood and affect  Skin - warm and dry, normal color, no suspicious lesions noted  Groin- left groin with 2 cm area of swelling/firmness, no erythema or warmth  Extremities:  No swelling or varicosities noted  Chaperone present for exam  Assessment and Plan:  1. Lump in the groin (Primary) Unclear etiology but it has been here for several months. Possible lymph node or hernia in differential, will get ultrasound imaging for further evaluation - US  LT LOWER EXTREM LTD SOFT TISSUE NON VASCULAR; Future   No follow-ups on file.  Future Appointments  Date Time Provider Department Center  05/07/2024  2:30 PM Jhonny Moss, MD [facility] None    Marci Setter, MD, FACOG Obstetrician & Gynecologist, [facility] for [facility], [facility]

## 2024-04-02 ENCOUNTER — Telehealth: Payer: Self-pay

## 2024-04-02 NOTE — Telephone Encounter (Signed)
 Pt called no answer left a voice mail to call the office back,  When she calls back I will let her know that, Dr Georjean has her FMLA from [employer], however Dr Georjean has not seen her since Sept. There are questions on the paperwork that we need to go over when she sees her in a clinic visit.

## 2024-04-02 NOTE — Telephone Encounter (Signed)
-----   Message from Darice CHRISTELLA Shivers sent at 04/02/2024  2:08 PM EDT ----- Regarding: FMLA Pls let her know I have her FMLA from [employer], however I have not seen her since Sept. There are questions on the paperwork that we need to go over when I see her in a clinic visit.  Thanks!

## 2024-04-03 ENCOUNTER — Encounter (HOSPITAL_BASED_OUTPATIENT_CLINIC_OR_DEPARTMENT_OTHER): Payer: Self-pay

## 2024-04-03 ENCOUNTER — Ambulatory Visit (HOSPITAL_BASED_OUTPATIENT_CLINIC_OR_DEPARTMENT_OTHER): Admission: RE | Admit: 2024-04-03 | Source: Ambulatory Visit

## 2024-04-03 NOTE — Telephone Encounter (Signed)
 Pt is on a wait list for sooner appt. She said that her paperwork is due the 30th of June she was upset that she had to be seen to have paper work done,

## 2024-04-03 NOTE — Telephone Encounter (Signed)
 I have a 2pm slot on June 30 if she can do that. The questions on this paperwork are different from her prior one, so I need to know how she wants me to answer them.

## 2024-04-03 NOTE — Telephone Encounter (Signed)
 Pt is coming in on June 30th to see Dr Georjean at 2 pm

## 2024-04-09 ENCOUNTER — Ambulatory Visit (INDEPENDENT_AMBULATORY_CARE_PROVIDER_SITE_OTHER): Admitting: Neurology

## 2024-04-09 ENCOUNTER — Telehealth: Payer: Self-pay | Admitting: Neurology

## 2024-04-09 ENCOUNTER — Encounter: Payer: Self-pay | Admitting: Neurology

## 2024-04-09 VITALS — BP 136/80 | HR 80 | Ht 66.0 in | Wt 272.0 lb

## 2024-04-09 DIAGNOSIS — G43009 Migraine without aura, not intractable, without status migrainosus: Secondary | ICD-10-CM | POA: Diagnosis not present

## 2024-04-09 DIAGNOSIS — G40009 Localization-related (focal) (partial) idiopathic epilepsy and epileptic syndromes with seizures of localized onset, not intractable, without status epilepticus: Secondary | ICD-10-CM | POA: Diagnosis not present

## 2024-04-09 DIAGNOSIS — Z0279 Encounter for issue of other medical certificate: Secondary | ICD-10-CM

## 2024-04-09 MED ORDER — TOPIRAMATE 100 MG PO TABS: 100.0000 mg | ORAL_TABLET | Freq: Two times a day (BID) | ORAL | 4 refills | Status: AC

## 2024-04-09 NOTE — Patient Instructions (Signed)
 Good to see you doing well! Congratulations!  Continue Topiramate  100mg  twice a day.   Follow-up in 6 months, call for any changes.    Seizure Precautions: 1. If medication has been prescribed for you to prevent seizures, take it exactly as directed.  Do not stop taking the medicine without talking to your doctor first, even if you have not had a seizure in a long time.   2. Avoid activities in which a seizure would cause danger to yourself or to others.  Don't operate dangerous machinery, swim alone, or climb in high or dangerous places, such as on ladders, roofs, or girders.  Do not drive unless your doctor says you may.  3. If you have any warning that you may have a seizure, lay down in a safe place where you can't hurt yourself.    4.  No driving for 6 months from last seizure, as per   state law.   Please refer to the following link on the [employer]ile[employer]'s website for more information: http://www.epilepsyfoundation.org/answerplace/Social/driving/drivingu.cfm   5.  Maintain good sleep hygiene. Avoid alcohol.  6.  Notify your neurology if you are planning pregnancy or if you become pregnant.  7.  Contact your doctor if you have any problems that may be related to the medicine you are taking.  8.  Call 911 and bring the patient back to the ED if:        A.  The seizure lasts longer than 5 minutes.       B.  The patient doesn't awaken shortly after the seizure  C.  The patient has new problems such as difficulty seeing, speaking or moving  D.  The patient was injured during the seizure  E.  The patient has a temperature over 102 F (39C)  F.  The patient vomited and now is having trouble breathing

## 2024-04-09 NOTE — Telephone Encounter (Signed)
 Paperwork faxed to [employer] [facility]
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
## 2024-04-09 NOTE — Telephone Encounter (Signed)
 Pt. Paid for [employer] forms and confirmation inbox

## 2024-04-20 ENCOUNTER — Telehealth (HOSPITAL_BASED_OUTPATIENT_CLINIC_OR_DEPARTMENT_OTHER): Payer: Self-pay

## 2024-05-07 ENCOUNTER — Ambulatory Visit: Admitting: Neurology

## 2024-08-22 ENCOUNTER — Encounter: Payer: Self-pay | Admitting: Neurology

## 2024-09-17 ENCOUNTER — Telehealth: Payer: Self-pay | Admitting: Neurology

## 2024-09-17 NOTE — Telephone Encounter (Signed)
 Who's calling (name and relationship to patient) : Laymon Carbon; Self  Best contact number: [phone]  Provider they see: Dr.Aquino  Reason for call: Brittain lvm stating that FMLA papers was faxed over and wanted to make sure that paper work was received. FMLA paper work need to be faxed back by the 15 th of this month. Requested call back.

## 2024-09-19 ENCOUNTER — Encounter (HOSPITAL_BASED_OUTPATIENT_CLINIC_OR_DEPARTMENT_OTHER): Payer: Self-pay | Admitting: Emergency Medicine

## 2024-09-19 ENCOUNTER — Telehealth: Payer: Self-pay | Admitting: Neurology

## 2024-09-19 ENCOUNTER — Other Ambulatory Visit: Payer: Self-pay

## 2024-09-19 ENCOUNTER — Emergency Department (HOSPITAL_BASED_OUTPATIENT_CLINIC_OR_DEPARTMENT_OTHER)
Admission: EM | Admit: 2024-09-19 | Discharge: 2024-09-19 | Disposition: A | Discharge status: Home / Self Care | Attending: Emergency Medicine | Admitting: Emergency Medicine

## 2024-09-19 DIAGNOSIS — J069 Acute upper respiratory infection, unspecified: Secondary | ICD-10-CM | POA: Diagnosis not present

## 2024-09-19 DIAGNOSIS — Z0279 Encounter for issue of other medical certificate: Secondary | ICD-10-CM

## 2024-09-19 DIAGNOSIS — R509 Fever, unspecified: Secondary | ICD-10-CM | POA: Diagnosis present

## 2024-09-19 LAB — GROUP A STREP BY PCR: Group A Strep by PCR: NOT DETECTED

## 2024-09-19 LAB — RESP PANEL BY RT-PCR (RSV, FLU A&B, COVID)  RVPGX2
Influenza A by PCR: NEGATIVE
Influenza B by PCR: NEGATIVE
Resp Syncytial Virus by PCR: NEGATIVE
SARS Coronavirus 2 by RT PCR: NEGATIVE

## 2024-09-19 MED ORDER — ACETAMINOPHEN 325 MG PO TABS
650.0000 mg | ORAL_TABLET | Freq: Once | ORAL | Status: AC | PRN
  Administered 2024-09-19: 650 mg via ORAL
  Filled 2024-09-19: qty 2

## 2024-09-19 NOTE — ED Triage Notes (Signed)
 URI Sx with SOB and sore throat. X 1 day.

## 2024-09-19 NOTE — ED Notes (Signed)
 RN provided AVS using Teachback Method. Patient verbalizes understanding of Discharge Instructions. Opportunity for Questioning and Answers were provided by RN. Patient Discharged from ED ambulatory to home via self.

## 2024-09-19 NOTE — Telephone Encounter (Signed)
 Pls let her know I filled it out. They only sent 2 pages for me to fill out, it looks like there are missing pages, I printed out the front sheet from last time which shows our address and information. Thanks

## 2024-09-19 NOTE — Telephone Encounter (Signed)
 Pt paid fee of $25 today for the FMLA documents and it was faxed to [employer] at  [phone]. Thanks

## 2024-09-19 NOTE — Telephone Encounter (Signed)
 Front is calling for $25 fee then will fax

## 2024-09-19 NOTE — ED Notes (Signed)
 Provider at the bedside.

## 2024-09-19 NOTE — ED Provider Notes (Signed)
 [place] [facility]  Provider Note  CSN: [id number] Arrival date & time: 09/19/24 0436  History Chief Complaint  Patient presents with   URI    Jamie Archer is a 33 y.o. female with history of well controlled seizure disorder reports about 24 hours of URI symptoms with runny nose, sore throat, cough and myalgias as well as subjective fever at home.   Home Medications Prior to Admission medications   Medication Sig Start Date End Date Taking? Authorizing Provider  etonogestrel  (NEXPLANON ) 68 MG IMPL implant 1 each by Subdermal route once.    [provider]  megestrol  (MEGACE ) 40 MG tablet Take 1 tablet (40 mg total) by mouth 2 (two) times daily. 02/17/24   Stinson, Jacob J, DO  topiramate  (TOPAMAX ) 100 MG tablet Take 1 tablet (100 mg total) by mouth 2 (two) times daily. 04/09/24   Georjean Darice HERO, MD     Allergies    Clindamycin /lincomycin, Lacosamide , Azithromycin , Codeine, and Penicillins   Review of Systems   Review of Systems Please see HPI for pertinent positives and negatives  Physical Exam BP 135/68 (BP Location: Right Arm)   Pulse 88   Temp 99.4 F (37.4 C) (Oral)   Resp 14   Ht 5' 6 (1.676 m)   Wt 115.7 kg   LMP 02/23/2024 (Approximate)   SpO2 99%   Breastfeeding No   BMI 41.16 kg/m   Physical Exam Vitals and nursing note reviewed.  Constitutional:      Appearance: Normal appearance.  HENT:     Head: Normocephalic and atraumatic.     Nose: Nose normal.     Mouth/Throat:     Mouth: Mucous membranes are moist.     Pharynx: No oropharyngeal exudate or posterior oropharyngeal erythema.  Eyes:     Extraocular Movements: Extraocular movements intact.     Conjunctiva/sclera: Conjunctivae normal.  Cardiovascular:     Rate and Rhythm: Normal rate.  Pulmonary:     Effort: Pulmonary effort is normal.     Breath sounds: Normal breath sounds. No wheezing or rales.  Abdominal:     General: Abdomen is flat.      Palpations: Abdomen is soft.     Tenderness: There is no abdominal tenderness.  Musculoskeletal:        General: No swelling. Normal range of motion.     Cervical back: Neck supple.  Lymphadenopathy:     Cervical: No cervical adenopathy.  Skin:    General: Skin is warm and dry.  Neurological:     General: No focal deficit present.     Mental Status: She is alert.  Psychiatric:        Mood and Affect: Mood normal.     ED Results / Procedures / Treatments   EKG None  Procedures Procedures  Medications Ordered in the ED Medications  acetaminophen  (TYLENOL ) tablet 650 mg (650 mg Oral Given 09/19/24 0447)    Initial Impression and Plan  Patient here with symptoms most consistent with viral URI. Exam is reassuring, vitals show mild fever/tachycardia but low concern for sepsis. Will check nasal/throat swabs and give APAP for fever.   ED Course   Clinical Course as of 09/19/24 0554  Wed Sep 19, 2024  0537 Covid/Flu/RSV and strep swab is neg. Patient resting comfortably. Likely a nonspecific viral infection. Recommend OTC symptomatic care, antipyretics, oral hydration, rest. PCP follow up, RTED for any other concerns.   [CS]    Clinical Course  User Index [CS] Roselyn Carlin NOVAK, MD     MDM Rules/Calculators/A&P Medical Decision Making Problems Addressed: Viral URI with cough: acute illness or injury  Amount and/or Complexity of Data Reviewed Labs: ordered. Decision-making details documented in ED Course.  Risk OTC drugs.     Final Clinical Impression(s) / ED Diagnoses Final diagnoses:  Viral URI with cough    Rx / DC Orders ED Discharge Orders     None        Roselyn Carlin NOVAK, MD 09/19/24 [phone]

## 2024-09-24 NOTE — Telephone Encounter (Signed)
 Paper work has been refaxed.

## 2024-09-24 NOTE — Telephone Encounter (Signed)
 FYI:Pt states [employer] never rcvd fax, refaxed twice 11:46am and 11:48am to fax [phone] and a copy is upfront for pickup

## 2024-09-24 NOTE — Telephone Encounter (Signed)
 Nusaybah, Self;  PH: [phone]  Called and lvm regarding FMLA papers. She stated that it has not been received yet and was calling to make sure all was sent and not some; and when they was sent. Requested call back.

## 2024-10-02 ENCOUNTER — Telehealth: Payer: Self-pay | Admitting: Neurology

## 2024-10-02 NOTE — Telephone Encounter (Signed)
 Sherri called this afternoon from [employer] and she needs the FMLA documents faxed back with the question page, they did not get the question page. The fax number is [phone] . Sherri stated that they can not approve Pt 's FMLA until they get the question page. Thanks

## 2024-10-03 NOTE — Telephone Encounter (Signed)
 Paperwork re faxed on 10/03/24 twice

## 2024-10-15 NOTE — Telephone Encounter (Signed)
 Pt would like copy of forms at front

## 2024-10-15 NOTE — Telephone Encounter (Signed)
 Pt called informed that copy of her forms are at the front desk

## 2024-10-16 ENCOUNTER — Encounter: Payer: Self-pay | Admitting: Neurology

## 2024-10-17 ENCOUNTER — Telehealth: Payer: Self-pay | Admitting: Neurology

## 2024-10-17 NOTE — Telephone Encounter (Signed)
 Paper work has been faxed [employer] sent

## 2024-10-17 NOTE — Telephone Encounter (Signed)
 Pt called in this morning. Pt stated that when the [employer]MA documents are completed, she would like them to be faxed. Pt also stated that she wants the forms to be uploaded in her My Chart as well. Thanks

## 2024-11-19 ENCOUNTER — Ambulatory Visit: Admitting: Neurology
